# Patient Record
Sex: Female | Born: 1960 | Race: White | Hispanic: No | Smoking: Former smoker
Health system: Southern US, Community
[De-identification: ages and names within clinical notes are randomized; demographics above are authoritative.]

## PROBLEM LIST (undated history)

## (undated) DIAGNOSIS — K219 Gastro-esophageal reflux disease without esophagitis: Secondary | ICD-10-CM

## (undated) DIAGNOSIS — K589 Irritable bowel syndrome without diarrhea: Secondary | ICD-10-CM

## (undated) DIAGNOSIS — F329 Major depressive disorder, single episode, unspecified: Secondary | ICD-10-CM

## (undated) DIAGNOSIS — M1990 Unspecified osteoarthritis, unspecified site: Secondary | ICD-10-CM

## (undated) DIAGNOSIS — T7840XA Allergy, unspecified, initial encounter: Secondary | ICD-10-CM

## (undated) DIAGNOSIS — F419 Anxiety disorder, unspecified: Secondary | ICD-10-CM

## (undated) DIAGNOSIS — F32A Depression, unspecified: Secondary | ICD-10-CM

## (undated) HISTORY — PX: ROUX-EN-Y PROCEDURE: SUR1287

## (undated) HISTORY — DX: Anxiety disorder, unspecified: F41.9

## (undated) HISTORY — DX: Allergy, unspecified, initial encounter: T78.40XA

## (undated) HISTORY — DX: Major depressive disorder, single episode, unspecified: F32.9

## (undated) HISTORY — DX: Irritable bowel syndrome, unspecified: K58.9

## (undated) HISTORY — PX: ABDOMINAL HYSTERECTOMY: SHX81

## (undated) HISTORY — DX: Gastro-esophageal reflux disease without esophagitis: K21.9

## (undated) HISTORY — DX: Depression, unspecified: F32.A

## (undated) HISTORY — DX: Unspecified osteoarthritis, unspecified site: M19.90

## (undated) HISTORY — PX: CHOLECYSTECTOMY: SHX55

---

## 2001-04-14 ENCOUNTER — Ambulatory Visit (HOSPITAL_COMMUNITY): Admission: RE | Admit: 2001-04-14 | Discharge: 2001-04-14 | Payer: Self-pay | Admitting: Obstetrics and Gynecology

## 2001-04-14 ENCOUNTER — Encounter: Payer: Self-pay | Admitting: Obstetrics and Gynecology

## 2001-07-14 ENCOUNTER — Encounter: Payer: Self-pay | Admitting: Internal Medicine

## 2001-07-14 ENCOUNTER — Ambulatory Visit (HOSPITAL_COMMUNITY): Admission: RE | Admit: 2001-07-14 | Discharge: 2001-07-14 | Payer: Self-pay | Admitting: Internal Medicine

## 2001-10-06 ENCOUNTER — Ambulatory Visit (HOSPITAL_COMMUNITY): Admission: RE | Admit: 2001-10-06 | Discharge: 2001-10-06 | Payer: Self-pay | Admitting: Internal Medicine

## 2001-12-07 ENCOUNTER — Encounter: Payer: Self-pay | Admitting: Family Medicine

## 2001-12-07 ENCOUNTER — Ambulatory Visit (HOSPITAL_COMMUNITY): Admission: RE | Admit: 2001-12-07 | Discharge: 2001-12-07 | Payer: Self-pay | Admitting: Family Medicine

## 2002-10-18 ENCOUNTER — Ambulatory Visit (HOSPITAL_COMMUNITY): Admission: RE | Admit: 2002-10-18 | Discharge: 2002-10-18 | Payer: Self-pay | Admitting: Family Medicine

## 2002-10-18 ENCOUNTER — Encounter: Payer: Self-pay | Admitting: Family Medicine

## 2002-11-15 ENCOUNTER — Encounter: Payer: Self-pay | Admitting: Family Medicine

## 2002-11-15 ENCOUNTER — Ambulatory Visit (HOSPITAL_COMMUNITY): Admission: RE | Admit: 2002-11-15 | Discharge: 2002-11-15 | Payer: Self-pay | Admitting: Family Medicine

## 2002-12-12 ENCOUNTER — Ambulatory Visit (HOSPITAL_COMMUNITY): Admission: RE | Admit: 2002-12-12 | Discharge: 2002-12-12 | Payer: Self-pay | Admitting: Family Medicine

## 2002-12-12 ENCOUNTER — Encounter: Payer: Self-pay | Admitting: Family Medicine

## 2003-07-02 ENCOUNTER — Ambulatory Visit (HOSPITAL_COMMUNITY): Admission: RE | Admit: 2003-07-02 | Discharge: 2003-07-02 | Payer: Self-pay | Admitting: Family Medicine

## 2003-07-10 ENCOUNTER — Ambulatory Visit (HOSPITAL_COMMUNITY): Admission: RE | Admit: 2003-07-10 | Discharge: 2003-07-10 | Payer: Self-pay | Admitting: Family Medicine

## 2005-04-17 ENCOUNTER — Ambulatory Visit (HOSPITAL_COMMUNITY): Admission: RE | Admit: 2005-04-17 | Discharge: 2005-04-17 | Payer: Self-pay | Admitting: Family Medicine

## 2005-08-13 ENCOUNTER — Encounter: Admission: RE | Admit: 2005-08-13 | Discharge: 2005-08-13 | Payer: Self-pay | Admitting: Obstetrics and Gynecology

## 2007-07-20 ENCOUNTER — Ambulatory Visit (HOSPITAL_COMMUNITY): Admission: RE | Admit: 2007-07-20 | Discharge: 2007-07-20 | Payer: Self-pay | Admitting: Family Medicine

## 2007-07-27 ENCOUNTER — Ambulatory Visit (HOSPITAL_COMMUNITY): Admission: RE | Admit: 2007-07-27 | Discharge: 2007-07-27 | Payer: Self-pay | Admitting: Family Medicine

## 2008-09-19 ENCOUNTER — Encounter: Admission: RE | Admit: 2008-09-19 | Discharge: 2008-09-19 | Payer: Self-pay | Admitting: Family Medicine

## 2008-11-04 ENCOUNTER — Ambulatory Visit (HOSPITAL_COMMUNITY): Admission: RE | Admit: 2008-11-04 | Discharge: 2008-11-04 | Payer: Self-pay | Admitting: Family Medicine

## 2010-04-12 ENCOUNTER — Encounter: Payer: Self-pay | Admitting: Family Medicine

## 2010-08-03 ENCOUNTER — Other Ambulatory Visit: Payer: Self-pay | Admitting: Obstetrics and Gynecology

## 2010-08-03 DIAGNOSIS — Z1231 Encounter for screening mammogram for malignant neoplasm of breast: Secondary | ICD-10-CM

## 2010-08-07 NOTE — Op Note (Signed)
[facility]  Patient:    Joan Duncan, Joan Duncan Visit Number: [id number] MRN: [id number]          Service Type: END Location: DAY Attending Physician:  Malissa Hippo Dictated by:   Lionel December, M.D. Proc. Date: 10/06/01 Admit Date:  10/06/2001 Discharge Date: 10/06/2001   CC:         Elfredia Nevins, M.D.   Operative Report  PROCEDURE:  Total colonoscopy, terminal ileoscopy.  ENDOSCOPIST:  Lionel December, M.D.  INDICATIONS:  Drinda Butts is a 50 year old Caucasian female with chronic constipation and recurrent abdominal pain.  She has also had intermittent hematochezia.  She is laxative dependent.  She is undergoing a diagnostic colonoscopy.  INFORMED CONSENT:  Risks were reviewed with the patient and informed consent was obtained.  PREOPERATIVE MEDICATIONS: Demerol 50 mg IV, Versed 8 mg IV in divided dose.  INSTRUMENT:  Olympus video system.  DESCRIPTION OF PROCEDURE:  The procedure was performed in the endoscopy suite. The patients vital signs and oxygen saturation were monitored during the procedure and remained stable.  The patient was placed in the left lateral position and rectal examination was performed.  No abnormality was noted on external or digital exam.  The scope was placed in the rectum and advanced under vision to the sigmoid colon and beyond.  Preparation was excellent. 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 The scope was passed to the cecum which was identified by the appendiceal orifice and ileocecal valve.  Short segment of TL was also examined and was negative.  Pictures were taken for the record which were part of her data base.  As the scope was withdrawn the colonic mucosa was once again carefully examined.  There were no polyps, tumors, masses or diverticular changes.  The rectal mucosa was normal.  The scope was retroflexed and examined in the rectal junction and small hemorrhoids  were noted below the dentate line.  The endoscope was straightened and withdrawn. The patient tolerated the procedure well.  FINAL DIAGNOSIS: 1. 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 2. Normal terminal ileum. 3. Small external hemorrhoids. 4. I suspect we could be dealing with constipation, predominant    irritable bowel syndrome.  RECOMMENDATIONS: 1. We will start on Zelnorm 6 mg p.o. b.i.d. 2. TSH and serum calcium will be checked today. 3. Also asked her to take Benefiber fiber 4 g p.o. q.d. 4. She will return for an office visit in four weeks.Dictated by:   Lionel December, M.D. Attending Physician:  Malissa Hippo DD:  10/06/01 TD:  10/11/01 Job: [id number]

## 2010-08-10 ENCOUNTER — Ambulatory Visit
Admission: RE | Admit: 2010-08-10 | Discharge: 2010-08-10 | Disposition: A | Discharge status: Home / Self Care | Payer: BC Managed Care – PPO | Source: Ambulatory Visit | Attending: Obstetrics and Gynecology | Admitting: Obstetrics and Gynecology

## 2010-08-10 DIAGNOSIS — Z1231 Encounter for screening mammogram for malignant neoplasm of breast: Secondary | ICD-10-CM

## 2011-04-21 ENCOUNTER — Other Ambulatory Visit (HOSPITAL_COMMUNITY): Payer: Self-pay | Admitting: Internal Medicine

## 2011-04-21 DIAGNOSIS — R1084 Generalized abdominal pain: Secondary | ICD-10-CM

## 2011-04-22 ENCOUNTER — Other Ambulatory Visit (HOSPITAL_COMMUNITY): Payer: BC Managed Care – PPO

## 2011-04-23 ENCOUNTER — Ambulatory Visit (HOSPITAL_COMMUNITY)
Admission: RE | Admit: 2011-04-23 | Discharge: 2011-04-23 | Disposition: A | Discharge status: Home / Self Care | Payer: BC Managed Care – PPO | Source: Ambulatory Visit | Attending: Internal Medicine | Admitting: Internal Medicine

## 2011-04-23 DIAGNOSIS — K449 Diaphragmatic hernia without obstruction or gangrene: Secondary | ICD-10-CM | POA: Insufficient documentation

## 2011-04-23 DIAGNOSIS — R109 Unspecified abdominal pain: Secondary | ICD-10-CM | POA: Insufficient documentation

## 2011-04-23 DIAGNOSIS — R1084 Generalized abdominal pain: Secondary | ICD-10-CM

## 2011-04-23 MED ORDER — IOHEXOL 300 MG/ML  SOLN
100.0000 mL | Freq: Once | INTRAMUSCULAR | Status: AC | PRN
  Administered 2011-04-23: 100 mL via INTRAVENOUS

## 2011-08-04 ENCOUNTER — Other Ambulatory Visit (HOSPITAL_COMMUNITY): Payer: Self-pay | Admitting: Physician Assistant

## 2011-08-04 DIAGNOSIS — R42 Dizziness and giddiness: Secondary | ICD-10-CM

## 2011-08-05 ENCOUNTER — Ambulatory Visit (HOSPITAL_COMMUNITY)
Admission: RE | Admit: 2011-08-05 | Discharge: 2011-08-05 | Disposition: A | Discharge status: Home / Self Care | Payer: BC Managed Care – PPO | Source: Ambulatory Visit | Attending: Physician Assistant | Admitting: Physician Assistant

## 2011-08-05 DIAGNOSIS — H538 Other visual disturbances: Secondary | ICD-10-CM | POA: Insufficient documentation

## 2011-08-05 DIAGNOSIS — R42 Dizziness and giddiness: Secondary | ICD-10-CM

## 2011-08-05 DIAGNOSIS — R209 Unspecified disturbances of skin sensation: Secondary | ICD-10-CM | POA: Insufficient documentation

## 2011-08-06 ENCOUNTER — Other Ambulatory Visit (HOSPITAL_COMMUNITY): Payer: BC Managed Care – PPO

## 2013-04-30 ENCOUNTER — Other Ambulatory Visit (HOSPITAL_COMMUNITY): Payer: Self-pay | Admitting: Internal Medicine

## 2013-04-30 DIAGNOSIS — Z139 Encounter for screening, unspecified: Secondary | ICD-10-CM

## 2013-05-02 ENCOUNTER — Ambulatory Visit (HOSPITAL_COMMUNITY): Payer: BC Managed Care – PPO

## 2013-05-07 ENCOUNTER — Ambulatory Visit (HOSPITAL_COMMUNITY)
Admission: RE | Admit: 2013-05-07 | Discharge: 2013-05-07 | Disposition: A | Discharge status: Home / Self Care | Payer: BC Managed Care – PPO | Source: Ambulatory Visit | Attending: Internal Medicine | Admitting: Internal Medicine

## 2013-05-07 DIAGNOSIS — Z1231 Encounter for screening mammogram for malignant neoplasm of breast: Secondary | ICD-10-CM | POA: Insufficient documentation

## 2013-05-07 DIAGNOSIS — Z139 Encounter for screening, unspecified: Secondary | ICD-10-CM

## 2013-10-30 ENCOUNTER — Other Ambulatory Visit (HOSPITAL_COMMUNITY): Payer: Self-pay | Admitting: Internal Medicine

## 2013-10-30 DIAGNOSIS — R51 Headache: Secondary | ICD-10-CM

## 2013-11-01 ENCOUNTER — Ambulatory Visit (HOSPITAL_COMMUNITY)
Admission: RE | Admit: 2013-11-01 | Discharge: 2013-11-01 | Disposition: A | Discharge status: Home / Self Care | Payer: BC Managed Care – PPO | Source: Ambulatory Visit | Attending: Internal Medicine | Admitting: Internal Medicine

## 2013-11-01 DIAGNOSIS — R51 Headache: Secondary | ICD-10-CM | POA: Diagnosis present

## 2014-05-22 ENCOUNTER — Ambulatory Visit (INDEPENDENT_AMBULATORY_CARE_PROVIDER_SITE_OTHER): Payer: BC Managed Care – PPO | Admitting: Obstetrics and Gynecology

## 2014-05-22 VITALS — BP 146/86 | Ht 68.0 in | Wt 179.0 lb

## 2014-05-22 DIAGNOSIS — N3946 Mixed incontinence: Secondary | ICD-10-CM | POA: Diagnosis not present

## 2014-05-22 DIAGNOSIS — N952 Postmenopausal atrophic vaginitis: Secondary | ICD-10-CM

## 2014-05-22 MED ORDER — TOLTERODINE TARTRATE ER 2 MG PO CP24: 2.0000 mg | ORAL_CAPSULE | Freq: Every day | ORAL | Status: DC

## 2014-05-22 MED ORDER — ESTRADIOL 1 MG PO TABS: 1.0000 mg | ORAL_TABLET | Freq: Every day | ORAL | Status: DC

## 2014-05-22 NOTE — Progress Notes (Signed)
Patient ID: MARCINA KINNISON, female   DOB: [birth year], 54 y.o.   MRN: [id number] Pt here today to check her bladder. Pt states that her bladder has dropped some more. Pt states that when she wipes after voiding she wipes her bladder. Pt states that she can't hold her urine.   [facility] Visit  Patient name: Joan Duncan MRN [id number]  Date of birth: Dec 28, 1960  CC & HPI:  Joan Duncan is a 53 y.o. female presenting today for urinary sx of SUI with running, exercising, also having urge sx while walking. + incomplete voiding at times.  ROS:  Vasomotor sx.  Pertinent History Reviewed:   Reviewed: Significant for vag hyst post repair 2001 jvf.  Medical        No past medical history on file.                            Surgical Hx:   No past surgical history on file. Medications: Reviewed & Updated - see associated section                       Current outpatient prescriptions:  .  ALPRAZolam (XANAX) 1 MG tablet, , Disp: , Rfl: 2 .  BIOTIN PO, Take 1 tablet by mouth daily., Disp: , Rfl:  .  HYDROcodone-acetaminophen (NORCO) 10-325 MG per tablet, Take 1 tablet by mouth every 4 (four) hours as needed., Disp: , Rfl: 0 .  loratadine (CLARITIN) 10 MG tablet, Take 10 mg by mouth daily., Disp: , Rfl:  .  meclizine (ANTIVERT) 25 MG tablet, Take 25 mg by mouth every 6 (six) hours., Disp: , Rfl: 11 .  MOVANTIK 25 MG TABS, Take 1 tablet by mouth daily., Disp: , Rfl: 11 .  Multiple Vitamin (MULTIVITAMIN) tablet, Take 1 tablet by mouth daily., Disp: , Rfl:  .  Omega-3 Fatty Acids (FISH OIL PEARLS PO), Take 1 capsule by mouth daily., Disp: , Rfl:  .  pantoprazole (PROTONIX) 40 MG tablet, , Disp: , Rfl:    Social History: Reviewed -  has no tobacco history on file.  Objective Findings:  Vitals: Blood pressure 146/86, height 5\' 8"  (1.727 m), weight 179 lb (81.194 kg).  Physical Examination: General appearance - alert, well appearing, and in no distress, oriented to person, place, and time  and normal appearing weight Mental status - alert, oriented to person, place, and time, normal mood, behavior, speech, dress, motor activity, and thought processes, affect normal Abdomen - soft, nontender, nondistended, no masses or organomegaly Pelvic - VULVA: normal appearing vulva with no masses, tenderness or lesions, VAGINA: normal appearing vagina with normal color and discharge, no lesions, atrophic, CERVIX: surgically absent, UTERUS: surgically absent, vaginal cuff well healed Posterior support average. Vag length 10 cm   Assessment & Plan:   A:  1. Mixed incontinence 2. Postmeno vag atrophic tissues  P:  1. Estradiol 1 mg/d 2. Trial Detrol LA

## 2014-05-22 NOTE — Patient Instructions (Signed)
Health Recommendations for Postmenopausal Women Respected and ongoing research has looked at the most common causes of death, disability, and poor quality of life in postmenopausal women. The causes include heart disease, diseases of blood vessels, diabetes, depression, cancer, and bone loss (osteoporosis). Many things can be done to help lower the chances of developing these and other common problems. CARDIOVASCULAR DISEASE Heart Disease: A heart attack is a medical emergency. Know the signs and symptoms of a heart attack. Below are things women can do to reduce their risk for heart disease.   Do not smoke. If you smoke, quit.  Aim for a healthy weight. Being overweight causes many preventable deaths. Eat a healthy and balanced diet and drink an adequate amount of liquids.  Get moving. Make a commitment to be more physically active. Aim for 30 minutes of activity on most, if not all days of the week.  Eat for heart health. Choose a diet that is low in saturated fat and cholesterol and eliminate trans fat. Include whole grains, vegetables, and fruits. Read and understand the labels on food containers before buying.  Know your numbers. Ask your caregiver to check your blood pressure, cholesterol (total, HDL, LDL, triglycerides) and blood glucose. Work with your caregiver on improving your entire clinical picture.  High blood pressure. Limit or stop your table salt intake (try salt substitute and food seasonings). Avoid salty foods and drinks. Read labels on food containers before buying. Eating well and exercising can help control high blood pressure. STROKE  Stroke is a medical emergency. Stroke may be the result of a blood clot in a blood vessel in the brain or by a brain hemorrhage (bleeding). Know the signs and symptoms of a stroke. To lower the risk of developing a stroke:  Avoid fatty foods.  Quit smoking.  Control your diabetes, blood pressure, and irregular heart rate. THROMBOPHLEBITIS  (BLOOD CLOT) OF THE LEG  Becoming overweight and leading a stationary lifestyle may also contribute to developing blood clots. Controlling your diet and exercising will help lower the risk of developing blood clots. CANCER SCREENING  Breast Cancer: Take steps to reduce your risk of breast cancer.  You should practice "breast self-awareness." This means understanding the normal appearance and feel of your breasts and should include breast self-examination. Any changes detected, no matter how small, should be reported to your caregiver.  After age 40, you should have a clinical breast exam (CBE) every year.  Starting at age 40, you should consider having a mammogram (breast X-ray) every year.  If you have a family history of breast cancer, talk to your caregiver about genetic screening.  If you are at high risk for breast cancer, talk to your caregiver about having an MRI and a mammogram every year.  Intestinal or Stomach Cancer: Tests to consider are a rectal exam, fecal occult blood, sigmoidoscopy, and colonoscopy. Women who are high risk may need to be screened at an earlier age and more often.  Cervical Cancer:  Beginning at age 30, you should have a Pap test every 3 years as long as the past 3 Pap tests have been normal.  If you have had past treatment for cervical cancer or a condition that could lead to cancer, you need Pap tests and screening for cancer for at least 20 years after your treatment.  If you had a hysterectomy for a problem that was not cancer or a condition that could lead to cancer, then you no longer need Pap tests.    If you are between ages 65 and 70, and you have had normal Pap tests going back 10 years, you no longer need Pap tests.  If Pap tests have been discontinued, risk factors (such as a new sexual partner) need to be reassessed to determine if screening should be resumed.  Some medical problems can increase the chance of getting cervical cancer. In these  cases, your caregiver may recommend more frequent screening and Pap tests.  Uterine Cancer: If you have vaginal bleeding after reaching menopause, you should notify your caregiver.  Ovarian Cancer: Other than yearly pelvic exams, there are no reliable tests available to screen for ovarian cancer at this time except for yearly pelvic exams.  Lung Cancer: Yearly chest X-rays can detect lung cancer and should be done on high risk women, such as cigarette smokers and women with chronic lung disease (emphysema).  Skin Cancer: A complete body skin exam should be done at your yearly examination. Avoid overexposure to the sun and ultraviolet light lamps. Use a strong sun block cream when in the sun. All of these things are important for lowering the risk of skin cancer. MENOPAUSE Menopause Symptoms: Hormone therapy products are effective for treating symptoms associated with menopause:  Moderate to severe hot flashes.  Night sweats.  Mood swings.  Headaches.  Tiredness.  Loss of sex drive.  Insomnia.  Other symptoms. Hormone replacement carries certain risks, especially in older women. Women who use or are thinking about using estrogen or estrogen with progestin treatments should discuss that with their caregiver. Your caregiver will help you understand the benefits and risks. The ideal dose of hormone replacement therapy is not known. The Food and Drug Administration (FDA) has concluded that hormone therapy should be used only at the lowest doses and for the shortest amount of time to reach treatment goals.  OSTEOPOROSIS Protecting Against Bone Loss and Preventing Fracture If you use hormone therapy for prevention of bone loss (osteoporosis), the risks for bone loss must outweigh the risk of the therapy. Ask your caregiver about other medications known to be safe and effective for preventing bone loss and fractures. To guard against bone loss or fractures, the following is recommended:  If  you are younger than age 50, take 1000 mg of calcium and at least 600 mg of Vitamin D per day.  If you are older than age 50 but younger than age 70, take 1200 mg of calcium and at least 600 mg of Vitamin D per day.  If you are older than age 70, take 1200 mg of calcium and at least 800 mg of Vitamin D per day. Smoking and excessive alcohol intake increases the risk of osteoporosis. Eat foods rich in calcium and vitamin D and do weight bearing exercises several times a week as your caregiver suggests. DIABETES Diabetes Mellitus: If you have type I or type 2 diabetes, you should keep your blood sugar under control with diet, exercise, and recommended medication. Avoid starchy and fatty foods, and too many sweets. Being overweight can make diabetes control more difficult. COGNITION AND MEMORY Cognition and Memory: Menopausal hormone therapy is not recommended for the prevention of cognitive disorders such as Alzheimer's disease or memory loss.  DEPRESSION  Depression may occur at any age, but it is common in elderly women. This may be because of physical, medical, social (loneliness), or financial problems and needs. If you are experiencing depression because of medical problems and control of symptoms, talk to your caregiver about this. Physical   activity and exercise may help with mood and sleep. Community and volunteer involvement may improve your sense of value and worth. If you have depression and you feel that the problem is getting worse or becoming severe, talk to your caregiver about which treatment options are best for you. ACCIDENTS  Accidents are common and can be serious in elderly woman. Prepare your house to prevent accidents. Eliminate throw rugs, place hand bars in bath, shower, and toilet areas. Avoid wearing high heeled shoes or walking on wet, snowy, and icy areas. Limit or stop driving if you have vision or hearing problems, or if you feel you are unsteady with your movements and  reflexes. HEPATITIS C Hepatitis C is a type of viral infection affecting the liver. It is spread mainly through contact with blood from an infected person. It can be treated, but if left untreated, it can lead to severe liver damage over the years. Many people who are infected do not know that the virus is in their blood. If you are a "baby-boomer", it is recommended that you have one screening test for Hepatitis C. IMMUNIZATIONS  Several immunizations are important to consider having during your senior years, including:   Tetanus, diphtheria, and pertussis booster shot.  Influenza every year before the flu season begins.  Pneumonia vaccine.  Shingles vaccine.  Others, as indicated based on your specific needs. Talk to your caregiver about these. Document Released: 04/30/2005 Document Revised: 07/23/2013 Document Reviewed: 12/25/2007 ExitCare Patient Information 2015 [employer]. This information is not intended to replace advice given to you by your health care provider. Make sure you discuss any questions you have with your health care provider.  

## 2014-07-05 ENCOUNTER — Encounter: Payer: Self-pay | Admitting: Obstetrics and Gynecology

## 2014-07-05 ENCOUNTER — Ambulatory Visit (INDEPENDENT_AMBULATORY_CARE_PROVIDER_SITE_OTHER): Payer: BC Managed Care – PPO | Admitting: Obstetrics and Gynecology

## 2014-07-05 VITALS — BP 140/100 | Ht 68.0 in | Wt 181.0 lb

## 2014-07-05 DIAGNOSIS — N393 Stress incontinence (female) (male): Secondary | ICD-10-CM | POA: Diagnosis not present

## 2014-07-05 NOTE — Progress Notes (Addendum)
Patient ID: MILADY FLEENER, female   DOB: 1960/04/20, 54 y.o.   MRN: [id number]  This chart was SCRIBED for Mallory Shirk, MD by Stephania Fragmin, ED Scribe. This patient was seen in room 1 and the patient's care was started at 9:24 AM.    [facility] Visit  Patient name: Joan Duncan MRN [id number]  Date of birth: 01-29-1961  CC & HPI:  Joan Duncan is a 54 y.o. female presenting today for follow-up of mixed incontinence and postmenopausal vaginal atrophic tissues. I had prescribed her Estradiol and a trial of Detrol LA.  Patient reports reduced incontinence after taking the Detrol LA. She however still notes incontinence when running.  Patient has also noted malodor emanating from her groin region. She is unsure whether that is due to urine remaining on her panties, or to fecal matter because she has a hemorrhoid.   She reports she hasn't had sex in 2 years with her 50 y.o. partner of 16 years. She would like to, but she is no longer sexually attracted to him, as he doesn't seem interested in taking care of himself hygienically anymore and is on disability.  ROS:  A complete 10 system review of systems was obtained and all systems are negative except as noted in the HPI and PMH.    Pertinent History Reviewed:   Reviewed: Significant for abdominal hysterectomy Medical         Past Medical History  Diagnosis Date  . IBS (irritable bowel syndrome)   . GERD (gastroesophageal reflux disease)   . Allergy   . Anxiety   . Depression   . Arthritis                               Surgical Hx:    Past Surgical History  Procedure Laterality Date  . Cholecystectomy    . Abdominal hysterectomy    . Roux-en-y procedure     Medications: Reviewed & Updated - see associated section                       Current outpatient prescriptions:  .  ALPRAZolam (XANAX) 1 MG tablet, , Disp: , Rfl: 2 .  BIOTIN PO, Take 1 tablet by mouth daily., Disp: , Rfl:  .  estradiol (ESTRACE) 1 MG tablet, Take  1 tablet (1 mg total) by mouth daily., Disp: 30 tablet, Rfl: 11 .  HYDROcodone-acetaminophen (NORCO) 10-325 MG per tablet, Take 1 tablet by mouth every 4 (four) hours as needed., Disp: , Rfl: 0 .  loratadine (CLARITIN) 10 MG tablet, Take 10 mg by mouth daily., Disp: , Rfl:  .  meclizine (ANTIVERT) 25 MG tablet, Take 25 mg by mouth every 6 (six) hours., Disp: , Rfl: 11 .  MOVANTIK 25 MG TABS, Take 1 tablet by mouth daily., Disp: , Rfl: 11 .  Multiple Vitamin (MULTIVITAMIN) tablet, Take 1 tablet by mouth daily., Disp: , Rfl:  .  Omega-3 Fatty Acids (FISH OIL PEARLS PO), Take 1 capsule by mouth daily., Disp: , Rfl:  .  pantoprazole (PROTONIX) 40 MG tablet, , Disp: , Rfl:  .  tolterodine (DETROL LA) 2 MG 24 hr capsule, Take 1 capsule (2 mg total) by mouth daily., Disp: 30 capsule, Rfl: 5   Social History: Reviewed -  reports that she has quit smoking. Her smoking use included Cigarettes. She quit after 40 years of use. She has  never used smokeless tobacco.  Objective Findings:  Vitals: Blood pressure 140/100, height 5\' 8"  (1.727 m), weight 181 lb (82.101 kg).  Physical Examination: General appearance - alert, well appearing, and in no distress, oriented to person, place, and time and overweight Mental status - alert, oriented to person, place, and time, normal mood, behavior, speech, dress, motor activity, and thought processes, affect appropriate to mood Pelvic - normal external genitalia VULVA: normal appearing vulva with no masses, tenderness or lesions,  VAGINA: postmenopausal tissues ADNEXA: normal adnexa in size, nontender and no masses,  RECTAL: good support   Assessment & Plan:   A:  1. SUI controlled by HT and Ditropan 2. Domestic issues,  Over 25 min spent in visit  P:  1. Renew Rx's x 1 yr  I personally performed the services described in this documentation, which was SCRIBED in my presence. The recorded information has been reviewed and considered accurate. It has been edited  as necessary during review. Jonnie Kind, MD

## 2014-07-05 NOTE — Progress Notes (Signed)
Patient ID: Joan Duncan, female   DOB: Dec 05, 1960, 54 y.o.   MRN: [id number] Pt here today for follow up from last visit. Pt states that the medication did help the bladder spasms but she still feels her bladder "down there". Pt states that she feels her bladder.

## 2014-11-04 ENCOUNTER — Other Ambulatory Visit: Payer: Self-pay | Admitting: Obstetrics and Gynecology

## 2015-03-07 ENCOUNTER — Other Ambulatory Visit: Payer: Self-pay | Admitting: Internal Medicine

## 2015-03-07 DIAGNOSIS — Z1231 Encounter for screening mammogram for malignant neoplasm of breast: Secondary | ICD-10-CM

## 2015-03-12 ENCOUNTER — Encounter (INDEPENDENT_AMBULATORY_CARE_PROVIDER_SITE_OTHER): Payer: Self-pay | Admitting: *Deleted

## 2015-03-28 ENCOUNTER — Ambulatory Visit
Admission: RE | Admit: 2015-03-28 | Discharge: 2015-03-28 | Disposition: A | Discharge status: Home / Self Care | Payer: BC Managed Care – PPO | Source: Ambulatory Visit | Attending: Internal Medicine | Admitting: Internal Medicine

## 2015-03-28 DIAGNOSIS — Z1231 Encounter for screening mammogram for malignant neoplasm of breast: Secondary | ICD-10-CM

## 2015-09-30 ENCOUNTER — Telehealth: Payer: Self-pay

## 2015-09-30 NOTE — Telephone Encounter (Signed)
Tried to call but she was not home

## 2015-09-30 NOTE — Telephone Encounter (Addendum)
Gastroenterology Pre-Procedure Review  Request Date: Requesting Physician:   PATIENT REVIEW QUESTIONS: The patient responded to the following health history questions as indicated:    1. Diabetes Melitis:NO 2. Joint replacements in the past 12 months: NO 3. Major health problems in the past 3 months: NO 4. Has an artificial valve or MVP: NO 5. Has a defibrillator: NO 6. Has been advised in past to take antibiotics in advance of a procedure like teeth cleaning: NO 7. Family history of colon cancer: NO 8. Alcohol Use: YES ,VERY LITTLE 9. History of sleep apnea: NO    MEDICATIONS & ALLERGIES:    Patient reports the following regarding taking any blood thinners:   Plavix? NO Aspirin? YES Coumadin? NO  Patient confirms/reports the following medications:  Current Outpatient Prescriptions  Medication Sig Dispense Refill  . ALPRAZolam (XANAX) 1 MG tablet   2  . aspirin 81 MG tablet Take 81 mg by mouth daily.    Marland Kitchen BIOTIN PO Take 1 tablet by mouth daily.    . cyclobenzaprine (FLEXERIL) 10 MG tablet Take 10 mg by mouth 3 (three) times daily as needed for muscle spasms.    Marland Kitchen loratadine (CLARITIN) 10 MG tablet Take 10 mg by mouth daily.    . Multiple Vitamin (MULTIVITAMIN) tablet Take 1 tablet by mouth daily.    . Omega-3 Fatty Acids (FISH OIL PEARLS PO) Take 1 capsule by mouth daily.    . pantoprazole (PROTONIX) 40 MG tablet     . zolpidem (AMBIEN) 10 MG tablet Take 10 mg by mouth at bedtime as needed for sleep.    Marland Kitchen estradiol (ESTRACE) 1 MG tablet Take 1 tablet (1 mg total) by mouth daily. 30 tablet 11   No current facility-administered medications for this visit.    Patient confirms/reports the following allergies:  No Known Allergies  No orders of the defined types were placed in this encounter.    AUTHORIZATION INFORMATION Primary Insurance: [place],  [place] #:[id number] ,  Group #: [id number] Pre-Cert / Josem Kaufmann required: Pre-Cert / Auth #:    SCHEDULE INFORMATION: Procedure has been  scheduled as follows:  Date: , Time:  Location:   This Gastroenterology Pre-Precedure Review Form is being routed to the following provider(s):  Held spot for 10/30/15 @ 8:30 AM for RMR

## 2015-09-30 NOTE — Telephone Encounter (Signed)
Patient called to schedule tcs   [phone] home, cell [phone]

## 2015-09-30 NOTE — Addendum Note (Signed)
Addended by: Marlou Porch on: 09/30/2015 04:13 PM   Modules accepted: Medications

## 2015-10-01 NOTE — Addendum Note (Signed)
Addended by: Mahala Menghini on: 10/01/2015 10:41 AM   Modules accepted: Orders, Medications

## 2015-10-01 NOTE — Telephone Encounter (Signed)
Ok to schedule.

## 2015-10-02 ENCOUNTER — Other Ambulatory Visit: Payer: Self-pay

## 2015-10-02 DIAGNOSIS — Z1211 Encounter for screening for malignant neoplasm of colon: Secondary | ICD-10-CM

## 2015-10-02 MED ORDER — PEG-KCL-NACL-NASULF-NA ASC-C 100 G PO SOLR: 1.0000 | ORAL | Status: DC

## 2015-10-02 NOTE — Telephone Encounter (Signed)
Instructions are in the mail and Rx called into pharmacy. She is aware

## 2015-10-30 ENCOUNTER — Ambulatory Visit (HOSPITAL_COMMUNITY)
Admission: RE | Admit: 2015-10-30 | Discharge: 2015-10-30 | Disposition: A | Discharge status: Home / Self Care | Payer: BC Managed Care – PPO | Source: Ambulatory Visit | Attending: Internal Medicine | Admitting: Internal Medicine

## 2015-10-30 ENCOUNTER — Encounter (HOSPITAL_COMMUNITY)
Admission: RE | Disposition: A | Discharge status: Home / Self Care | Payer: Self-pay | Source: Ambulatory Visit | Attending: Internal Medicine

## 2015-10-30 ENCOUNTER — Encounter (HOSPITAL_COMMUNITY): Payer: Self-pay | Admitting: *Deleted

## 2015-10-30 DIAGNOSIS — F329 Major depressive disorder, single episode, unspecified: Secondary | ICD-10-CM | POA: Insufficient documentation

## 2015-10-30 DIAGNOSIS — Z79899 Other long term (current) drug therapy: Secondary | ICD-10-CM | POA: Diagnosis not present

## 2015-10-30 DIAGNOSIS — M1990 Unspecified osteoarthritis, unspecified site: Secondary | ICD-10-CM | POA: Insufficient documentation

## 2015-10-30 DIAGNOSIS — Z1211 Encounter for screening for malignant neoplasm of colon: Secondary | ICD-10-CM | POA: Diagnosis not present

## 2015-10-30 DIAGNOSIS — Z7982 Long term (current) use of aspirin: Secondary | ICD-10-CM | POA: Insufficient documentation

## 2015-10-30 DIAGNOSIS — K641 Second degree hemorrhoids: Secondary | ICD-10-CM | POA: Diagnosis not present

## 2015-10-30 DIAGNOSIS — K589 Irritable bowel syndrome without diarrhea: Secondary | ICD-10-CM | POA: Insufficient documentation

## 2015-10-30 DIAGNOSIS — K219 Gastro-esophageal reflux disease without esophagitis: Secondary | ICD-10-CM | POA: Diagnosis not present

## 2015-10-30 DIAGNOSIS — Z87891 Personal history of nicotine dependence: Secondary | ICD-10-CM | POA: Insufficient documentation

## 2015-10-30 DIAGNOSIS — D122 Benign neoplasm of ascending colon: Secondary | ICD-10-CM | POA: Diagnosis not present

## 2015-10-30 DIAGNOSIS — Z8371 Family history of colonic polyps: Secondary | ICD-10-CM

## 2015-10-30 DIAGNOSIS — F419 Anxiety disorder, unspecified: Secondary | ICD-10-CM | POA: Diagnosis not present

## 2015-10-30 HISTORY — PX: COLONOSCOPY: SHX5424

## 2015-10-30 HISTORY — PX: POLYPECTOMY: SHX5525

## 2015-10-30 SURGERY — COLONOSCOPY
Anesthesia: Moderate Sedation

## 2015-10-30 MED ORDER — MIDAZOLAM HCL 5 MG/5ML IJ SOLN
INTRAMUSCULAR | Status: DC | PRN
  Administered 2015-10-30: 2 mg via INTRAVENOUS
  Administered 2015-10-30: 1 mg via INTRAVENOUS
  Administered 2015-10-30: 2 mg via INTRAVENOUS
  Administered 2015-10-30: 1 mg via INTRAVENOUS
  Administered 2015-10-30: 2 mg via INTRAVENOUS

## 2015-10-30 MED ORDER — LIDOCAINE HCL 2 % EX GEL
CUTANEOUS | Status: AC
  Filled 2015-10-30: qty 30

## 2015-10-30 MED ORDER — MIDAZOLAM HCL 5 MG/5ML IJ SOLN
INTRAMUSCULAR | Status: AC
  Filled 2015-10-30: qty 10

## 2015-10-30 MED ORDER — PROMETHAZINE HCL 25 MG/ML IJ SOLN
INTRAMUSCULAR | Status: AC
  Filled 2015-10-30: qty 1

## 2015-10-30 MED ORDER — ONDANSETRON HCL 4 MG/2ML IJ SOLN
INTRAMUSCULAR | Status: AC
  Filled 2015-10-30: qty 2

## 2015-10-30 MED ORDER — STERILE WATER FOR IRRIGATION IR SOLN
Status: DC | PRN
  Administered 2015-10-30: 09:00:00

## 2015-10-30 MED ORDER — MEPERIDINE HCL 100 MG/ML IJ SOLN
INTRAMUSCULAR | Status: DC | PRN
  Administered 2015-10-30 (×2): 50 mg via INTRAVENOUS
  Administered 2015-10-30: 25 mg via INTRAVENOUS

## 2015-10-30 MED ORDER — PROMETHAZINE HCL 25 MG/ML IJ SOLN
INTRAMUSCULAR | Status: DC | PRN
  Administered 2015-10-30: 12.5 mg via INTRAVENOUS

## 2015-10-30 MED ORDER — MEPERIDINE HCL 100 MG/ML IJ SOLN
INTRAMUSCULAR | Status: AC
  Filled 2015-10-30: qty 2

## 2015-10-30 MED ORDER — ONDANSETRON HCL 4 MG/2ML IJ SOLN
INTRAMUSCULAR | Status: DC | PRN
  Administered 2015-10-30: 4 mg via INTRAVENOUS

## 2015-10-30 MED ORDER — SODIUM CHLORIDE 0.9 % IV SOLN
INTRAVENOUS | Status: DC
  Administered 2015-10-30: 1000 mL via INTRAVENOUS

## 2015-10-30 NOTE — H&P (Signed)
$'@LOGO'H$ @   Primary Care Physician:  Glo Herring., MD Primary Gastroenterologist:  Dr. Gala Romney  Pre-Procedure History & Physical: HPI:  Joan Duncan is a 55 y.o. female is here for a screening colonoscopy.   Chronic constipation. History of colonoscopy around age 67. Father with a history of colon polyps. 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  Past Medical History:  Diagnosis Date  . Allergy   . Anxiety   . Arthritis   . Depression   . GERD (gastroesophageal reflux disease)   . IBS (irritable bowel syndrome)     Past Surgical History:  Procedure Laterality Date  . ABDOMINAL HYSTERECTOMY    . CHOLECYSTECTOMY    . ROUX-EN-Y PROCEDURE      Prior to Admission medications   Medication Sig Start Date End Date Taking? Authorizing Provider  ALPRAZolam Duanne Moron) 1 MG tablet  04/27/14  Yes Historical Provider, MD  cyclobenzaprine (FLEXERIL) 10 MG tablet Take 10 mg by mouth 3 (three) times daily as needed for muscle spasms.   Yes Historical Provider, MD  aspirin 81 MG tablet Take 81 mg by mouth daily.    Historical Provider, MD  BIOTIN PO Take 1 tablet by mouth daily.    Historical Provider, MD  estradiol (ESTRACE) 1 MG tablet Take 1 tablet (1 mg total) by mouth daily. 05/22/14 05/22/15  Jonnie Kind, MD  loratadine (CLARITIN) 10 MG tablet Take 10 mg by mouth daily.    Historical Provider, MD  Multiple Vitamin (MULTIVITAMIN) tablet Take 1 tablet by mouth daily.    Historical Provider, MD  Omega-3 Fatty Acids (FISH OIL PEARLS PO) Take 1 capsule by mouth daily.    Historical Provider, MD  pantoprazole (PROTONIX) 40 MG tablet  05/20/14   Historical Provider, MD  peg 3350 powder (MOVIPREP) 100 g SOLR Take 1 kit (200 g total) by mouth as directed. 10/02/15   Daneil Dolin, MD  zolpidem (AMBIEN) 10 MG tablet Take 10 mg by mouth at bedtime as needed for sleep.    Historical Provider, MD    Allergies as of 10/02/2015  . (No Known  Allergies)    Family History  Problem Relation Age of Onset  . Stroke Mother   . Alzheimer's disease Mother   . Heart attack Brother     Social History   Social History  . Marital status: Married    Spouse name: N/A  . Number of children: N/A  . Years of education: N/A   Occupational History  . Not on file.   Social History Main Topics  . Smoking status: Former Smoker    Years: 40.00    Types: Cigarettes  . Smokeless tobacco: Never Used     Comment: quit 3 years ago  . Alcohol use Yes     Comment: on occasion  . Drug use: No  . Sexual activity: Not Currently   Other Topics Concern  . Not on file   Social History Narrative  . No narrative on file    Review of Systems: See HPI, otherwise negative ROS  Physical Exam: BP (!) 134/94   Pulse 76   Temp 97.8 F (36.6 C) (Oral)   Resp 15   Ht '5\' 8"'$  (1.727 m)   Wt 185 lb (83.9 kg)   SpO2 99%   BMI 28.13 kg/m  General:   Alert,  Well-developed, well-nourished, pleasant and cooperative in NAD Head:  Normocephalic and atraumatic. Lungs:  Clear throughout to  auscultation.   No wheezes, crackles, or rhonchi. No acute distress. Heart:  Regular rate and rhythm; no murmurs, clicks, rubs,  or gallops. Abdomen:  Soft, nontender and nondistended. No masses, hepatosplenomegaly or hernias noted. Normal bowel sounds, without guarding, and without rebound.   Extremities:  Without clubbing or edema. Neurologic:  Alert and  oriented x4;  grossly normal neurologically.  Impression/Plan: Joan Duncan is now here to undergo a screening colonoscopy.  Positive family history of colon polyps.  Risks, benefits, limitations, imponderables and alternatives regarding colonoscopy have been reviewed with the patient. Questions have been answered. All parties agreeable.     Notice:  This dictation was prepared with Dragon dictation along with smaller phrase technology. Any transcriptional errors that result from this process are  unintentional and may not be corrected upon review.

## 2015-10-30 NOTE — Discharge Instructions (Addendum)
Colonoscopy Discharge Instructions  Read the instructions outlined below and refer to this sheet in the next few weeks. These discharge instructions provide you with general information on caring for yourself after you leave the hospital. Your doctor may also give you specific instructions. While your treatment has been planned according to the most current medical practices available, unavoidable complications occasionally occur. If you have any problems or questions after discharge, call Dr. Gala Romney at [phone]. ACTIVITY  You may resume your regular activity, but move at a slower pace for the next 24 hours.   Take frequent rest periods for the next 24 hours.   Walking will help get rid of the air and reduce the bloated feeling in your belly (abdomen).   No driving for 24 hours (because of the medicine (anesthesia) used during the test).    Do not sign any important legal documents or operate any machinery for 24 hours (because of the anesthesia used during the test).  NUTRITION  Drink plenty of fluids.   You may resume your normal diet as instructed by your doctor.   Begin with a light meal and progress to your normal diet. Heavy or fried foods are harder to digest and may make you feel sick to your stomach (nauseated).   Avoid alcoholic beverages for 24 hours or as instructed.  MEDICATIONS  You may resume your normal medications unless your doctor tells you otherwise.  WHAT YOU CAN EXPECT TODAY  Some feelings of bloating in the abdomen.   Passage of more gas than usual.   Spotting of blood in your stool or on the toilet paper.  IF YOU HAD POLYPS REMOVED DURING THE COLONOSCOPY:  No aspirin products for 7 days or as instructed.   No alcohol for 7 days or as instructed.   Eat a soft diet for the next 24 hours.  FINDING OUT THE RESULTS OF YOUR TEST Not all test results are available during your visit. If your test results are not back during the visit, make an appointment  with your caregiver to find out the results. Do not assume everything is normal if you have not heard from your caregiver or the medical facility. It is important for you to follow up on all of your test results.  SEEK IMMEDIATE MEDICAL ATTENTION IF:  You have more than a spotting of blood in your stool.   Your belly is swollen (abdominal distention).   You are nauseated or vomiting.   You have a temperature over 101.   You have abdominal pain or discomfort that is severe or gets worse throughout the day.   Constipation, hemorrhoid and colon polyp information provided  Pamphlet on hemorrhoids banding provided  Office visit with [place] in 3 months  Use MiraLAX 17 g orally at bedtime as needed for constipation  Further recommendations to follow pending review of pathology report  Colon Polyps Polyps are lumps of extra tissue growing inside the body. Polyps can grow in the large intestine (colon). Most colon polyps are noncancerous (benign). However, some colon polyps can become cancerous over time. Polyps that are larger than a pea may be harmful. To be safe, caregivers remove and test all polyps. CAUSES  Polyps form when mutations in the genes cause your cells to grow and divide even though no more tissue is needed. RISK FACTORS There are a number of risk factors that can increase your chances of getting colon polyps. They include:  Being older than 50 years.  Family history of  colon polyps or colon cancer.  Long-term colon diseases, such as colitis or Crohn disease.  Being overweight.  Smoking.  Being inactive.  Drinking too much alcohol. SYMPTOMS  Most small polyps do not cause symptoms. If symptoms are present, they may include:  Blood in the stool. The stool may look dark red or black.  Constipation or diarrhea that lasts longer than 1 week. DIAGNOSIS People often do not know they have polyps until their caregiver finds them during a regular checkup. Your caregiver  can use 4 tests to check for polyps:  Digital rectal exam. The caregiver wears gloves and feels inside the rectum. This test would find polyps only in the rectum.  Barium enema. The caregiver puts a liquid called barium into your rectum before taking X-rays of your colon. Barium makes your colon look white. Polyps are dark, so they are easy to see in the X-ray pictures.  Sigmoidoscopy. A thin, flexible tube (sigmoidoscope) is placed into your rectum. The sigmoidoscope has a light and tiny camera in it. The caregiver uses the sigmoidoscope to look at the last third of your colon.  Colonoscopy. This test is like sigmoidoscopy, but the caregiver looks at the entire colon. This is the most common method for finding and removing polyps. TREATMENT  Any polyps will be removed during a sigmoidoscopy or colonoscopy. The polyps are then tested for cancer. PREVENTION  To help lower your risk of getting more colon polyps:  Eat plenty of fruits and vegetables. Avoid eating fatty foods.  Do not smoke.  Avoid drinking alcohol.  Exercise every day.  Lose weight if recommended by your caregiver.  Eat plenty of calcium and folate. Foods that are rich in calcium include milk, cheese, and broccoli. Foods that are rich in folate include chickpeas, kidney beans, and spinach. HOME CARE INSTRUCTIONS Keep all follow-up appointments as directed by your caregiver. You may need periodic exams to check for polyps. SEEK MEDICAL CARE IF: You notice bleeding during a bowel movement.   This information is not intended to replace advice given to you by your health care provider. Make sure you discuss any questions you have with your health care provider.   Document Released: 12/03/2003 Document Revised: 03/29/2014 Document Reviewed: 05/18/2011 Elsevier Interactive Patient Education 2016 [employer].   Hemorrhoids Hemorrhoids are swollen veins around the rectum or anus. There are two types of hemorrhoids:    Internal hemorrhoids. These occur in the veins just inside the rectum. They may poke through to the outside and become irritated and painful.  External hemorrhoids. These occur in the veins outside the anus and can be felt as a painful swelling or hard lump near the anus. CAUSES  Pregnancy.   Obesity.   Constipation or diarrhea.   Straining to have a bowel movement.   Sitting for long periods on the toilet.  Heavy lifting or other activity that caused you to strain.  Anal intercourse. SYMPTOMS   Pain.   Anal itching or irritation.   Rectal bleeding.   Fecal leakage.   Anal swelling.   One or more lumps around the anus.  DIAGNOSIS  Your caregiver may be able to diagnose hemorrhoids by visual examination. Other examinations or tests that may be performed include:   Examination of the rectal area with a gloved hand (digital rectal exam).   Examination of anal canal using a small tube (scope).   A blood test if you have lost a significant amount of blood.  A test to look  inside the colon (sigmoidoscopy or colonoscopy). TREATMENT Most hemorrhoids can be treated at home. However, if symptoms do not seem to be getting better or if you have a lot of rectal bleeding, your caregiver may perform a procedure to help make the hemorrhoids get smaller or remove them completely. Possible treatments include:   Placing a rubber band at the base of the hemorrhoid to cut off the circulation (rubber band ligation).   Injecting a chemical to shrink the hemorrhoid (sclerotherapy).   Using a tool to burn the hemorrhoid (infrared light therapy).   Surgically removing the hemorrhoid (hemorrhoidectomy).   Stapling the hemorrhoid to block blood flow to the tissue (hemorrhoid stapling).  HOME CARE INSTRUCTIONS   Eat foods with fiber, such as whole grains, beans, nuts, fruits, and vegetables. Ask your doctor about taking products with added fiber in them  (fibersupplements).  Increase fluid intake. Drink enough water and fluids to keep your urine clear or pale yellow.   Exercise regularly.   Go to the bathroom when you have the urge to have a bowel movement. Do not wait.   Avoid straining to have bowel movements.   Keep the anal area dry and clean. Use wet toilet paper or moist towelettes after a bowel movement.   Medicated creams and suppositories may be used or applied as directed.   Only take over-the-counter or prescription medicines as directed by your caregiver.   Take warm sitz baths for 15-20 minutes, 3-4 times a day to ease pain and discomfort.   Place ice packs on the hemorrhoids if they are tender and swollen. Using ice packs between sitz baths may be helpful.   Put ice in a plastic bag.   Place a towel between your skin and the bag.   Leave the ice on for 15-20 minutes, 3-4 times a day.   Do not use a donut-shaped pillow or sit on the toilet for long periods. This increases blood pooling and pain.  SEEK MEDICAL CARE IF:  You have increasing pain and swelling that is not controlled by treatment or medicine.  You have uncontrolled bleeding.  You have difficulty or you are unable to have a bowel movement.  You have pain or inflammation outside the area of the hemorrhoids. MAKE SURE YOU:  Understand these instructions.  Will watch your condition.  Will get help right away if you are not doing well or get worse.   This information is not intended to replace advice given to you by your health care provider. Make sure you discuss any questions you have with your health care provider.   Document Released: 03/05/2000 Document Revised: 02/23/2012 Document Reviewed: 01/11/2012 Elsevier Interactive Patient Education 2016 [employer].  Constipation, Adult Constipation is when a person has fewer than three bowel movements a week, has difficulty having a bowel movement, or has stools that are dry, hard,  or larger than normal. As people grow older, constipation is more common. A low-fiber diet, not taking in enough fluids, and taking certain medicines may make constipation worse.  CAUSES   Certain medicines, such as antidepressants, pain medicine, iron supplements, antacids, and water pills.   Certain diseases, such as diabetes, irritable bowel syndrome (IBS), thyroid disease, or depression.   Not drinking enough water.   Not eating enough fiber-rich foods.   Stress or travel.   Lack of physical activity or exercise.   Ignoring the urge to have a bowel movement.   Using laxatives too much.  SIGNS AND SYMPTOMS  Having fewer than three bowel movements a week.   Straining to have a bowel movement.   Having stools that are hard, dry, or larger than normal.   Feeling full or bloated.   Pain in the lower abdomen.   Not feeling relief after having a bowel movement.  DIAGNOSIS  Your health care provider will take a medical history and perform a physical exam. Further testing may be done for severe constipation. Some tests may include:  A barium enema X-ray to examine your rectum, colon, and, sometimes, your small intestine.   A sigmoidoscopy to examine your lower colon.   A colonoscopy to examine your entire colon. TREATMENT  Treatment will depend on the severity of your constipation and what is causing it. Some dietary treatments include drinking more fluids and eating more fiber-rich foods. Lifestyle treatments may include regular exercise. If these diet and lifestyle recommendations do not help, your health care provider may recommend taking over-the-counter laxative medicines to help you have bowel movements. Prescription medicines may be prescribed if over-the-counter medicines do not work.  HOME CARE INSTRUCTIONS   Eat foods that have a lot of fiber, such as fruits, vegetables, whole grains, and beans.  Limit foods high in fat and processed sugars, such as  french fries, hamburgers, cookies, candies, and soda.   A fiber supplement may be added to your diet if you cannot get enough fiber from foods.   Drink enough fluids to keep your urine clear or pale yellow.   Exercise regularly or as directed by your health care provider.   Go to the restroom when you have the urge to go. Do not hold it.   Only take over-the-counter or prescription medicines as directed by your health care provider. Do not take other medicines for constipation without talking to your health care provider first.  [place] IF:   You have bright red blood in your stool.   Your constipation lasts for more than 4 days or gets worse.   You have abdominal or rectal pain.   You have thin, pencil-like stools.   You have unexplained weight loss. MAKE SURE YOU:   Understand these instructions.  Will watch your condition.  Will get help right away if you are not doing well or get worse.   This information is not intended to replace advice given to you by your health care provider. Make sure you discuss any questions you have with your health care provider.   Document Released: 12/05/2003 Document Revised: 03/29/2014 Document Reviewed: 12/18/2012 Elsevier Interactive Patient Education [employer].

## 2015-10-30 NOTE — Op Note (Signed)
[facility] Patient Name: Joan Duncan Procedure Date: 10/30/2015 8:43 AM MRN: [id number] Date of Birth: May 29, 1960 Attending MD: Norvel Richards , MD CSN: [id number] Age: 55 Admit Type: Outpatient Procedure:                Colonoscopy with snare polypectomy Indications:              Colon cancer screening in patient at increased                            risk: Family history of 1st-degree relative with                            colon polyps Providers:                Norvel Richards, MD, Renda Rolls, RN, Randa Spike, Technician Referring MD:              Medicines:                Midazolam 8 mg IV, Meperidine 125 mg IV,                            Promethazine [id number] mg IV Complications:            No immediate complications. Estimated Blood Loss:     Estimated blood loss was minimal. Procedure:                Pre-Anesthesia Assessment:                           - Prior to the procedure, a History and Physical                            was performed, and patient medications and                            allergies were reviewed. The patient's tolerance of                            previous anesthesia was also reviewed. The risks                            and benefits of the procedure and the sedation                            options and risks were discussed with the patient.                            All questions were answered, and informed consent                            was obtained. Prior Anticoagulants: The patient has  taken no previous anticoagulant or antiplatelet                            agents. ASA Grade Assessment: II - A patient with                            mild systemic disease. After reviewing the risks                            and benefits, the patient was deemed in                            satisfactory condition to undergo the procedure.                           After obtaining  informed consent, the colonoscope                            was passed under direct vision. Throughout the                            procedure, the patient's blood pressure, pulse, and                            oxygen saturations were monitored continuously. The                            [id number] scope was introduced through                            the anus and advanced to the the cecum, identified                            by appendiceal orifice and ileocecal valve. The                            ileocecal valve, appendiceal orifice, and rectum                            were photographed. The colonoscopy was performed                            without difficulty. The patient tolerated the                            procedure well. The quality of the bowel                            preparation was adequate. Scope In: 9:11:45 AM Scope Out: 9:30:00 AM Scope Withdrawal Time: 0 hours 12 minutes 37 seconds  Total Procedure Duration: 0 hours 18 minutes 15 seconds  Findings:      The perianal and digital rectal examinations were normal.      A 4 mm polyp was found in the ascending colon.  The polyp was sessile.       The polyp was removed with a cold snare. Resection and retrieval were       complete. Estimated blood loss was minimal.      Internal hemorrhoids were found during retroflexion. The hemorrhoids       were Grade II (internal hemorrhoids that prolapse but reduce       spontaneously).      The exam was otherwise without abnormality on direct and retroflexion       views. Impression:               - One 4 mm polyp in the ascending colon, removed                            with a cold snare. Resected and retrieved.                           - Internal hemorrhoids.                           - The examination was otherwise normal on direct                            and retroflexion views. Moderate Sedation:      Moderate (conscious) sedation was administered by  the endoscopy nurse       and supervised by the endoscopist. The following parameters were       monitored: oxygen saturation, heart rate, blood pressure, respiratory       rate, EKG, adequacy of pulmonary ventilation, and response to care.       Total physician intraservice time was 37 minutes. Recommendation:           - Patient has a contact number available for                            emergencies. The signs and symptoms of potential                            delayed complications were discussed with the                            patient. Return to normal activities tomorrow.                            Written discharge instructions were provided to the                            patient.                           - Advance diet as tolerated.                           - Continue present medications.                           - Miralax 1 capful (17 grams) in 8 ounces of water  PO daily indefinitely.                           - Repeat colonoscopy date to be determined after                            pending pathology results are reviewed for                            surveillance based on pathology results.                           - Return to GI office in 3 months.                           - Repeat colonoscopy for surveillance based on                            pathology results. Patient may be a reasonably good                            hemorrhoid banding candidate. Procedure Code(s):        --- Professional ---                           [phone], Colonoscopy, flexible; with removal of                            tumor(s), polyp(s), or other lesion(s) by snare                            technique                           99152, Moderate sedation services provided by the                            same physician or other qualified health care                            professional performing the diagnostic or                            therapeutic  service that the sedation supports,                            requiring the presence of an independent trained                            observer to assist in the monitoring of the                            patient's level of consciousness and physiological  status; initial 15 minutes of intraservice time,                            patient age 96 years or older                           [phone], Moderate sedation services; each additional                            15 minutes intraservice time Diagnosis Code(s):        --- Professional ---                           Z83.71, Family history of colonic polyps                           D12.2, Benign neoplasm of ascending colon                           K64.1, Second degree hemorrhoids CPT copyright 2016 American Medical Association. All rights reserved. The codes documented in this report are preliminary and upon coder review may  be revised to meet current compliance requirements. Cristopher Estimable. Terrina Docter, MD Norvel Richards, MD 10/30/2015 9:41:59 AM This report has been signed electronically. Number of Addenda: 0

## 2015-11-03 ENCOUNTER — Encounter (HOSPITAL_COMMUNITY): Payer: Self-pay | Admitting: Internal Medicine

## 2015-11-04 ENCOUNTER — Encounter: Payer: Self-pay | Admitting: Internal Medicine

## 2016-01-30 ENCOUNTER — Ambulatory Visit: Payer: BC Managed Care – PPO | Admitting: Gastroenterology

## 2016-02-19 ENCOUNTER — Ambulatory Visit: Payer: BC Managed Care – PPO | Admitting: Gastroenterology

## 2016-06-16 ENCOUNTER — Telehealth: Payer: Self-pay | Admitting: *Deleted

## 2016-06-16 NOTE — Telephone Encounter (Signed)
Pt called stating she had an upcoming procedure and needed to know if she still had a cervix. She stated that Dr Glo Herring did surgery on her around 2001 and she couldn't remember. On reviewing her records it was noted that both uterus and cervix were absent. Informed patient who verbalized understanding.

## 2016-06-23 ENCOUNTER — Telehealth: Payer: Self-pay | Admitting: Gastroenterology

## 2016-06-23 NOTE — Telephone Encounter (Signed)
Printed previous Colon report and path report and placed on DOD AM Dr.Amrbruster's desk for review.

## 2016-06-24 ENCOUNTER — Encounter: Payer: Self-pay | Admitting: Gastroenterology

## 2016-06-24 NOTE — Telephone Encounter (Signed)
Dr.Armbruster reviewed records and accepted patient. Left message for patient to call back to schedule appointment. Records will be in records reviewed folder.

## 2016-08-02 ENCOUNTER — Ambulatory Visit: Payer: BC Managed Care – PPO | Admitting: Gastroenterology

## 2016-09-28 ENCOUNTER — Ambulatory Visit: Payer: BC Managed Care – PPO | Admitting: Gastroenterology

## 2017-01-18 ENCOUNTER — Other Ambulatory Visit (HOSPITAL_COMMUNITY): Payer: Self-pay | Admitting: Internal Medicine

## 2017-01-18 ENCOUNTER — Ambulatory Visit (HOSPITAL_COMMUNITY)
Admission: RE | Admit: 2017-01-18 | Discharge: 2017-01-18 | Disposition: A | Discharge status: Home / Self Care | Payer: BC Managed Care – PPO | Source: Ambulatory Visit | Attending: Internal Medicine | Admitting: Internal Medicine

## 2017-01-18 ENCOUNTER — Encounter (HOSPITAL_COMMUNITY): Payer: Self-pay

## 2017-01-18 DIAGNOSIS — R918 Other nonspecific abnormal finding of lung field: Secondary | ICD-10-CM | POA: Diagnosis not present

## 2017-01-18 DIAGNOSIS — R079 Chest pain, unspecified: Secondary | ICD-10-CM

## 2017-01-18 DIAGNOSIS — R0600 Dyspnea, unspecified: Secondary | ICD-10-CM | POA: Diagnosis present

## 2017-01-18 MED ORDER — IOPAMIDOL (ISOVUE-370) INJECTION 76%
75.0000 mL | Freq: Once | INTRAVENOUS | Status: AC | PRN
  Administered 2017-01-18: 75 mL via INTRAVENOUS

## 2017-01-21 ENCOUNTER — Emergency Department (HOSPITAL_COMMUNITY): Payer: BC Managed Care – PPO

## 2017-01-21 ENCOUNTER — Other Ambulatory Visit: Payer: Self-pay

## 2017-01-21 ENCOUNTER — Emergency Department (HOSPITAL_COMMUNITY)
Admission: EM | Admit: 2017-01-21 | Discharge: 2017-01-21 | Disposition: A | Discharge status: Home / Self Care | Payer: BC Managed Care – PPO | Attending: Emergency Medicine | Admitting: Emergency Medicine

## 2017-01-21 ENCOUNTER — Encounter (HOSPITAL_COMMUNITY): Payer: Self-pay | Admitting: Emergency Medicine

## 2017-01-21 DIAGNOSIS — Z7982 Long term (current) use of aspirin: Secondary | ICD-10-CM | POA: Insufficient documentation

## 2017-01-21 DIAGNOSIS — J9801 Acute bronchospasm: Secondary | ICD-10-CM

## 2017-01-21 DIAGNOSIS — R0602 Shortness of breath: Secondary | ICD-10-CM | POA: Diagnosis present

## 2017-01-21 DIAGNOSIS — Z87891 Personal history of nicotine dependence: Secondary | ICD-10-CM | POA: Insufficient documentation

## 2017-01-21 DIAGNOSIS — Z79899 Other long term (current) drug therapy: Secondary | ICD-10-CM | POA: Diagnosis not present

## 2017-01-21 LAB — CBC WITH DIFFERENTIAL/PLATELET
BASOS PCT: 1 %
Basophils Absolute: 0 10*3/uL (ref 0.0–0.1)
EOS ABS: 0.1 10*3/uL (ref 0.0–0.7)
Eosinophils Relative: 2 %
HCT: 40.2 % (ref 36.0–46.0)
HEMOGLOBIN: 13.2 g/dL (ref 12.0–15.0)
Lymphocytes Relative: 43 %
Lymphs Abs: 2.6 10*3/uL (ref 0.7–4.0)
MCH: 30.6 pg (ref 26.0–34.0)
MCHC: 32.8 g/dL (ref 30.0–36.0)
MCV: 93.1 fL (ref 78.0–100.0)
MONOS PCT: 8 %
Monocytes Absolute: 0.5 10*3/uL (ref 0.1–1.0)
NEUTROS PCT: 46 %
Neutro Abs: 2.9 10*3/uL (ref 1.7–7.7)
PLATELETS: 247 10*3/uL (ref 150–400)
RBC: 4.32 MIL/uL (ref 3.87–5.11)
RDW: 13.1 % (ref 11.5–15.5)
WBC: 6.2 10*3/uL (ref 4.0–10.5)

## 2017-01-21 LAB — COMPREHENSIVE METABOLIC PANEL
ALBUMIN: 4.5 g/dL (ref 3.5–5.0)
ALT: 22 U/L (ref 14–54)
ANION GAP: 12 (ref 5–15)
AST: 23 U/L (ref 15–41)
Alkaline Phosphatase: 103 U/L (ref 38–126)
BUN: 16 mg/dL (ref 6–20)
CHLORIDE: 105 mmol/L (ref 101–111)
CO2: 23 mmol/L (ref 22–32)
Calcium: 9.7 mg/dL (ref 8.9–10.3)
Creatinine, Ser: 0.86 mg/dL (ref 0.44–1.00)
GFR calc Af Amer: >60 mL/min (ref >60)
GFR calc non Af Amer: >60 mL/min (ref >60)
GLUCOSE: 97 mg/dL (ref 65–99)
POTASSIUM: 4 mmol/L (ref 3.5–5.1)
SODIUM: 140 mmol/L (ref 135–145)
Total Bilirubin: 0.6 mg/dL (ref 0.3–1.2)
Total Protein: 7.9 g/dL (ref 6.5–8.1)

## 2017-01-21 LAB — BRAIN NATRIURETIC PEPTIDE: B Natriuretic Peptide: 16 pg/mL (ref 0.0–100.0)

## 2017-01-21 LAB — TROPONIN I: Troponin I: <0.03 ng/mL (ref <0.03)

## 2017-01-21 MED ORDER — ALBUTEROL SULFATE HFA 108 (90 BASE) MCG/ACT IN AERS
1.0000 | INHALATION_SPRAY | RESPIRATORY_TRACT | Status: DC | PRN
  Administered 2017-01-21: 2 via RESPIRATORY_TRACT
  Filled 2017-01-21: qty 6.7

## 2017-01-21 MED ORDER — PREDNISONE 20 MG PO TABS: ORAL_TABLET | ORAL | 0 refills | Status: DC

## 2017-01-21 MED ORDER — ALBUTEROL SULFATE (2.5 MG/3ML) 0.083% IN NEBU
5.0000 mg | INHALATION_SOLUTION | Freq: Once | RESPIRATORY_TRACT | Status: AC
  Administered 2017-01-21: 5 mg via RESPIRATORY_TRACT
  Filled 2017-01-21: qty 6

## 2017-01-21 MED ORDER — METHYLPREDNISOLONE SODIUM SUCC 125 MG IJ SOLR
125.0000 mg | Freq: Once | INTRAMUSCULAR | Status: AC
  Administered 2017-01-21: 125 mg via INTRAMUSCULAR
  Filled 2017-01-21: qty 2

## 2017-01-21 NOTE — ED Notes (Signed)
Respiratory paged

## 2017-01-21 NOTE — ED Provider Notes (Signed)
[facility] EMERGENCY DEPARTMENT Provider Note   CSN: [id number] Arrival date & time: 01/21/17  1331     History   Chief Complaint Chief Complaint  Patient presents with  . Shortness of Breath    HPI Joan Duncan is a 56 y.o. female.  HPI Patient presents with 1 week of increased shortness of breath.  States she has shortness of breath especially with exertion.  Mild cough.  No fever or chills.  States she does have some throat tightness from time to time.  No new exposures though she does have 2 dogs that live in her house.  Had CT angiogram of the chest recently without evidence of PE.  Previous history of smoking but quit 5 years ago. Past Medical History:  Diagnosis Date  . Allergy   . Anxiety   . Arthritis   . Depression   . GERD (gastroesophageal reflux disease)   . IBS (irritable bowel syndrome)     Patient Active Problem List   Diagnosis Date Noted  . SUI (stress urinary incontinence), female 07/05/2014  . Mixed incontinence urge and stress 05/22/2014  . Postmenopausal atrophic vaginitis 05/22/2014    Past Surgical History:  Procedure Laterality Date  . ABDOMINAL HYSTERECTOMY    . CHOLECYSTECTOMY    . ROUX-EN-Y PROCEDURE      OB History    Gravida Para Term Preterm AB Living   2 2 2     2    SAB TAB Ectopic Multiple Live Births                   Home Medications    Prior to Admission medications   Medication Sig Start Date End Date Taking? Authorizing Provider  ALPRAZolam Duanne Moron) 1 MG tablet  04/27/14   [provider]  aspirin 81 MG tablet Take 81 mg by mouth daily.    [provider]  BIOTIN PO Take 1 tablet by mouth daily.    [provider]  cyclobenzaprine (FLEXERIL) 10 MG tablet Take 10 mg by mouth 3 (three) times daily as needed for muscle spasms.    [provider]  estradiol (ESTRACE) 1 MG tablet Take 1 tablet (1 mg total) by mouth daily. 05/22/14 05/22/15  Jonnie Kind, MD  loratadine (CLARITIN) 10 MG  tablet Take 10 mg by mouth daily.    [provider]  Multiple Vitamin (MULTIVITAMIN) tablet Take 1 tablet by mouth daily.    [provider]  Omega-3 Fatty Acids (FISH OIL PEARLS PO) Take 1 capsule by mouth daily.    [provider]  pantoprazole (PROTONIX) 40 MG tablet  05/20/14   [provider]  peg 3350 powder (MOVIPREP) 100 g SOLR Take 1 kit (200 g total) by mouth as directed. 10/02/15   Rourk, Cristopher Estimable, MD  predniSONE (DELTASONE) 20 MG tablet 3 tabs po day one, then 2 po daily x 4 days 01/22/17   Julianne Rice, MD  zolpidem (AMBIEN) 10 MG tablet Take 10 mg by mouth at bedtime as needed for sleep.    [provider]    Family History Family History  Problem Relation Age of Onset  . Stroke Mother   . Alzheimer's disease Mother   . Heart attack Brother     Social History Social History   Tobacco Use  . Smoking status: Former Smoker    Years: 40.00    Types: Cigarettes  . Smokeless tobacco: Never Used  . Tobacco comment: quit 3 years ago  Substance Use Topics  . Alcohol use: No  . Drug use: No     Allergies   Patient has no known allergies.   Review of Systems Review of Systems  Constitutional: Negative for chills and fever.  HENT: Negative for congestion, sinus pressure, sore throat and trouble swallowing.   Respiratory: Positive for cough, chest tightness and shortness of breath.   Cardiovascular: Positive for leg swelling. Negative for chest pain and palpitations.  Gastrointestinal: Negative for abdominal pain, constipation, diarrhea, nausea and vomiting.  Genitourinary: Negative for dysuria, flank pain and frequency.  Musculoskeletal: Negative for back pain, myalgias, neck pain and neck stiffness.  Skin: Negative for rash and wound.  Neurological: Negative for dizziness, weakness, light-headedness, numbness and headaches.  All other systems reviewed and are negative.    Physical Exam Updated Vital Signs BP 118/72    Pulse 88   Temp 98 F (36.7 C) (Oral)   Resp 18   Ht 5' 8"  (1.727 m)   Wt 83 kg (183 lb)   SpO2 99%   BMI 27.83 kg/m   Physical Exam  Constitutional: She is oriented to person, place, and time. She appears well-developed and well-nourished. No distress.  HENT:  Head: Normocephalic and atraumatic.  Mouth/Throat: Oropharynx is clear and moist. No oropharyngeal exudate.  Eyes: Pupils are equal, round, and reactive to light. EOM are normal.  Neck: Normal range of motion. Neck supple.  Cardiovascular: Normal rate and regular rhythm.  Exam reveals no gallop and no friction rub.   No murmur heard. Pulmonary/Chest: Effort normal.  Prolonged expiratory phase with scattered end expiratory wheezes  Abdominal: Soft. Bowel sounds are normal. There is no tenderness. There is no rebound and no guarding.  Musculoskeletal: Normal range of motion. She exhibits no edema or tenderness.  No lower extremity swelling, asymmetry or tenderness.  Neurological: She is alert and oriented to person, place, and time.  Moving all extremities without focal deficit.  Sensation fully intact.  Skin: Skin is warm and dry. Capillary refill takes less than 2 seconds. No rash noted. She is not diaphoretic. No erythema.  Psychiatric: She has a normal mood and affect. Her behavior is normal.  Nursing note and vitals reviewed.    ED Treatments / Results  Labs (all labs ordered are listed, but only abnormal results are displayed) Labs Reviewed  CBC WITH DIFFERENTIAL/PLATELET  COMPREHENSIVE METABOLIC PANEL  TROPONIN I  BRAIN NATRIURETIC PEPTIDE    EKG  EKG Interpretation  Date/Time:  Friday January 21 2017 13:51:12 EDT Ventricular Rate:  83 PR Interval:  150 QRS Duration: 78 QT Interval:  366 QTC Calculation: 430 R Axis:   54 Text Interpretation:  Normal sinus rhythm Possible Left atrial enlargement Borderline ECG Confirmed by Julianne Rice [phone]) on 01/21/2017 4:14:07 PM       Radiology No  results found.  Procedures Procedures (including critical care time)  Medications Ordered in ED Medications  methylPREDNISolone sodium succinate (SOLU-MEDROL) 125 mg/2 mL injection 125 mg (125 mg Intramuscular Given 01/21/17 1613)  albuterol (PROVENTIL) (2.5 MG/3ML) 0.083% nebulizer solution 5 mg (5 mg Nebulization Given 01/21/17 1628)     Initial Impression / Assessment and Plan / ED Course  I have reviewed the triage vital signs and the nursing notes.  Pertinent labs & imaging results that were available during my care of the patient were reviewed by me and considered in my medical decision making (see chart for details).    Patient says she is feeling much better after breathing  treatment.  Patient likely has some residual bronchospasm from recent bronchitis.  Will give short course of steroids and given albuterol inhaler in the emergency department.  Advised to follow-up with her primary physician.  May need pulmonary referral if her symptoms persist.  Final Clinical Impressions(s) / ED Diagnoses   Final diagnoses:  Bronchospasm    New Prescriptions This SmartLink is deprecated. Use AVSMEDLIST instead to display the medication list for a patient.   Julianne Rice, MD 01/23/17 1759

## 2017-01-21 NOTE — ED Triage Notes (Signed)
Patient c/o shortness of breath x1 month. Patient seen by Dr Gerarda Fraction a week ago and had chest CT and EKG. Patient told that she had chest pleurisy and given steriod injection. Patient denies any improvement and states that Dr Gerarda Fraction sent her here to ER to be evaluated. Denies any fevers. Non-productive cough.

## 2017-06-14 ENCOUNTER — Other Ambulatory Visit (HOSPITAL_COMMUNITY): Payer: Self-pay | Admitting: Internal Medicine

## 2017-06-14 DIAGNOSIS — R109 Unspecified abdominal pain: Secondary | ICD-10-CM

## 2017-06-30 ENCOUNTER — Ambulatory Visit (HOSPITAL_COMMUNITY)
Admission: RE | Admit: 2017-06-30 | Discharge: 2017-06-30 | Disposition: A | Discharge status: Home / Self Care | Payer: BC Managed Care – PPO | Source: Ambulatory Visit | Attending: Internal Medicine | Admitting: Internal Medicine

## 2017-06-30 DIAGNOSIS — I700 Atherosclerosis of aorta: Secondary | ICD-10-CM | POA: Diagnosis not present

## 2017-06-30 DIAGNOSIS — M47816 Spondylosis without myelopathy or radiculopathy, lumbar region: Secondary | ICD-10-CM | POA: Insufficient documentation

## 2017-06-30 DIAGNOSIS — R109 Unspecified abdominal pain: Secondary | ICD-10-CM

## 2017-06-30 DIAGNOSIS — M160 Bilateral primary osteoarthritis of hip: Secondary | ICD-10-CM | POA: Insufficient documentation

## 2017-06-30 MED ORDER — IOPAMIDOL (ISOVUE-300) INJECTION 61%
100.0000 mL | Freq: Once | INTRAVENOUS | Status: AC | PRN
  Administered 2017-06-30: 100 mL via INTRAVENOUS

## 2018-09-13 ENCOUNTER — Other Ambulatory Visit: Payer: Self-pay

## 2018-09-13 ENCOUNTER — Ambulatory Visit
Admission: EM | Admit: 2018-09-13 | Discharge: 2018-09-13 | Disposition: A | Discharge status: Home / Self Care | Payer: BC Managed Care – PPO | Attending: Emergency Medicine | Admitting: Emergency Medicine

## 2018-09-13 ENCOUNTER — Ambulatory Visit (INDEPENDENT_AMBULATORY_CARE_PROVIDER_SITE_OTHER): Payer: BC Managed Care – PPO

## 2018-09-13 DIAGNOSIS — M7918 Myalgia, other site: Secondary | ICD-10-CM

## 2018-09-13 DIAGNOSIS — M25551 Pain in right hip: Secondary | ICD-10-CM

## 2018-09-13 DIAGNOSIS — M79641 Pain in right hand: Secondary | ICD-10-CM | POA: Diagnosis not present

## 2018-09-13 DIAGNOSIS — M79644 Pain in right finger(s): Secondary | ICD-10-CM

## 2018-09-13 DIAGNOSIS — W19XXXA Unspecified fall, initial encounter: Secondary | ICD-10-CM | POA: Diagnosis not present

## 2018-09-13 NOTE — ED Triage Notes (Signed)
Pt fell at work today fell on right hand hand and right hip , no swelling or deformity noted

## 2018-09-13 NOTE — ED Provider Notes (Signed)
[place]   [id number] 09/13/18 Arrival Time: 6568  CC:RT thumb and buttock pain  SUBJECTIVE: History from: patient. Joan Duncan is a 58 y.o. female hx significant for allergies, anxiety, arthritis, depression, GERD, and IBS, complains of right thumb and buttock pain that began earlier today.  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  Localizes the pain to the right thumb and right buttock.  Pain is constant and dull. Has tried OTC aleve with relief.  Symptoms are made worse with thumb ROM and sitting on buttock.  Reports previous low back and hip pain.  Denies fever, chills, erythema, ecchymosis, effusion, weakness, numbness and tingling, saddle paresthesias, loss of bowel or bladder function.      ROS: As per HPI.  Past Medical History:  Diagnosis Date   Allergy    Anxiety    Arthritis    Depression    GERD (gastroesophageal reflux disease)    IBS (irritable bowel syndrome)    Past Surgical History:  Procedure Laterality Date   ABDOMINAL HYSTERECTOMY     CHOLECYSTECTOMY     COLONOSCOPY N/A 10/30/2015   Procedure: COLONOSCOPY;  Surgeon: Daneil Dolin, MD;  Location: [facility];  Service: Endoscopy;  Laterality: N/A;  830   POLYPECTOMY  10/30/2015   Procedure: POLYPECTOMY;  Surgeon: Daneil Dolin, MD;  Location: [facility];  Service: Endoscopy;;  Ascending colon polyp removed via cold snare   ROUX-EN-Y PROCEDURE     No Known Allergies No current facility-administered medications on file prior to encounter.    Current Outpatient Medications on File Prior to Encounter  Medication Sig Dispense Refill   ALPRAZolam (XANAX) 1 MG tablet   2   aspirin 81 MG tablet Take 81 mg by mouth daily.     BIOTIN PO Take 1 tablet by mouth daily.     cyclobenzaprine (FLEXERIL) 10 MG tablet Take 10 mg by mouth 3  (three) times daily as needed for muscle spasms.     loratadine (CLARITIN) 10 MG tablet Take 10 mg by mouth daily.     Multiple Vitamin (MULTIVITAMIN) tablet Take 1 tablet by mouth daily.     Omega-3 Fatty Acids (FISH OIL PEARLS PO) Take 1 capsule by mouth daily.     pantoprazole (PROTONIX) 40 MG tablet      zolpidem (AMBIEN) 10 MG tablet Take 10 mg by mouth at bedtime as needed for sleep.     [DISCONTINUED] estradiol (ESTRACE) 1 MG tablet Take 1 tablet (1 mg total) by mouth daily. 30 tablet 11   Social History   Socioeconomic History   Marital status: Married    Spouse name: Not on file   Number of children: Not on file   Years of education: Not on file   Highest education level: Not on file  Occupational History   Not on file  Social Needs   Financial resource strain: Not on file   Food insecurity    Worry: Not on file    Inability: Not on file   Transportation needs    Medical: Not on file    Non-medical: Not on file  Tobacco Use   Smoking status: Former Smoker    Years: 40.00    Types: Cigarettes   Smokeless tobacco: Never Used   Tobacco comment: quit 3 years ago  Substance and Sexual  Activity   Alcohol use: No   Drug use: No   Sexual activity: Not Currently  Lifestyle   Physical activity    Days per week: Not on file    Minutes per session: Not on file   Stress: Not on file  Relationships   Social connections    Talks on phone: Not on file    Gets together: Not on file    Attends religious service: Not on file    Active member of club or organization: Not on file    Attends meetings of clubs or organizations: Not on file    Relationship status: Not on file   Intimate partner violence    Fear of current or ex partner: Not on file    Emotionally abused: Not on file    Physically abused: Not on file    Forced sexual activity: Not on file  Other Topics Concern   Not on file  Social History Narrative   Not on file   Family History    Problem Relation Age of Onset   Stroke Mother    Alzheimer's disease Mother    Heart attack Brother     OBJECTIVE:  Vitals:   09/13/18 1558  BP: (!) 155/88  Pulse: 72  Resp: 18  Temp: 98.2 F (36.8 C)    General appearance: ALERT; in no acute distress.  Head: NCAT Lungs: Normal respiratory effort CV: radial pulse 2+. Cap refill < 2 seconds Musculoskeletal:  Right hand Inspection: Skin warm, dry, clear and intact without obvious erythema, effusion, or ecchymosis.   Palpation: TTP over first MCP joint and proximal phalange; no snuff box tenderness ROM: FROM active and passive Strength: 5/5 shld abduction, 5/5 shld adduction, 5/5 elbow flexion, 5/5 elbow extension, 5/5 grip strength Right hip Inspection: Skin warm, dry, clear and intact without obvious erythema, effusion, or ecchymosis.   Palpation: TTP over the RT buttock and greater sciatic notch; no midline tenderness ROM: FROM active and passive Strength: 5/5 hip flexion, 5/5 knee abduction, 5/5 knee adduction, 5/5 knee flexion, 5/5 knee extension +Faber test Skin: warm and dry Neurologic: Ambulates without difficulty; Sensation intact about the upper/ lower extremities Psychological: alert and cooperative; normal mood and affect  DIAGNOSTIC STUDIES:  Dg Hand Complete Right  Result Date: 09/13/2018 CLINICAL DATA:  Right hand pain after fall. EXAM: RIGHT HAND - COMPLETE 3+ VIEW COMPARISON:  None. FINDINGS: No acute fracture or dislocation. Mild osteoarthritis of the thumb IP joint, second through fifth DIP joints, second and third MCP joints, and distal radioulnar joint. Moderate osteoarthritis of the scaphotrapeziotrapezoid joint. Bone mineralization is normal. Soft tissues are unremarkable. IMPRESSION: 1.  No acute osseous abnormality. 2. Scattered mild-to-moderate osteoarthritis throughout the hand. Electronically Signed   By: Titus Dubin M.D.   On: 09/13/2018 16:57   Dg Hip Unilat With Pelvis 2-3 Views  Right  Result Date: 09/13/2018 CLINICAL DATA:  Right hip pain after fall. EXAM: DG HIP (WITH OR WITHOUT PELVIS) 2-3V RIGHT COMPARISON:  CT abdomen pelvis dated June 30, 2017. FINDINGS: No acute fracture or dislocation. Mild-to-moderate right hip joint space narrowing with prominent marginal osteophytes. Bone mineralization is normal. Soft tissues are unremarkable. IMPRESSION: 1.  No acute osseous abnormality. 2. 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 Electronically Signed   By: Titus Dubin M.D.   On: 09/13/2018 16:54     ASSESSMENT & PLAN:  1. Right buttock pain   2. Fall   3. Pain of right thumb     No orders  of the defined types were placed in this encounter.  X-rays did not show fracture or dislocation, symptoms most likely musculoskeletal in nature Thumb spica placed Continue conservative management of rest, ice, and elevation Limit painful activities Take OTC aleve as needed for pain relief (may cause abdominal discomfort, ulcers, and GI bleeds avoid taking with other NSAIDs) Follow up with PCP or occupation health if symptoms persist Return or go to the ER if you have any new or worsening symptoms (fever, chills, chest pain, abdominal pain, changes in bowel or bladder habits, pain radiating into lower legs, etc...)   Reviewed expectations re: course of current medical issues. Questions answered. Outlined signs and symptoms indicating need for more acute intervention. Patient verbalized understanding. After Visit Summary given.    Lestine Box, PA-C 09/13/18 1735

## 2018-09-13 NOTE — Discharge Instructions (Addendum)
X-rays did not show fracture or dislocation, symptoms most likely musculoskeletal in nature Thumb spica placed Continue conservative management of rest, ice, and elevation Limit painful activities Take OTC aleve as needed for pain relief (may cause abdominal discomfort, ulcers, and GI bleeds avoid taking with other NSAIDs) Follow up with PCP or occupation health if symptoms persist Return or go to the ER if you have any new or worsening symptoms (fever, chills, chest pain, abdominal pain, changes in bowel or bladder habits, pain radiating into lower legs, etc...)

## 2019-01-26 ENCOUNTER — Encounter: Payer: Self-pay | Admitting: Internal Medicine

## 2019-02-13 ENCOUNTER — Other Ambulatory Visit: Payer: Self-pay

## 2019-02-13 ENCOUNTER — Encounter: Payer: Self-pay | Admitting: Internal Medicine

## 2019-02-13 ENCOUNTER — Ambulatory Visit: Payer: BC Managed Care – PPO | Admitting: Internal Medicine

## 2019-02-13 VITALS — BP 134/75 | HR 68 | Temp 97.6°F | Ht 68.0 in | Wt 186.0 lb

## 2019-02-13 DIAGNOSIS — R1310 Dysphagia, unspecified: Secondary | ICD-10-CM | POA: Diagnosis not present

## 2019-02-13 DIAGNOSIS — K219 Gastro-esophageal reflux disease without esophagitis: Secondary | ICD-10-CM | POA: Diagnosis not present

## 2019-02-13 DIAGNOSIS — R1319 Other dysphagia: Secondary | ICD-10-CM

## 2019-02-13 NOTE — Progress Notes (Signed)
Primary Care Physician:  Joan School, MD Primary Gastroenterologist:  Dr. Gala Romney  Pre-Procedure History & Physical: HPI:  Joan Duncan is a 58 y.o. female here for further evaluation of refractory reflux symptoms.  States has had reflux for over 10 years.  Currently on Protonix 40 mg twice daily.  Gets "choked" from time to time.  Coughs and feels things do not always go down sometimes has to "cough food up".  No prior food impaction.  Reflux symptoms at night in spite of taking Protonix 40 mg twice daily.  Former smoker.  No prior EGD. She reminded me that she had a complicated cholecystectomy up at [facility] in 1997.  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  Spent 2 weeks in the hospital. She had a CT scan for abdominal pain in 2017 which revealed DJD in the hip and back but no significant GI findings aside from a small hiatal hernia.  Family history colon polyps and a personal history of colon polyps.  Small adenoma removed from her colon 2017; slated for surveillance examination 2022.  She denies any lower GI tract symptoms.  Has not had any bleeding.  Denies abdominal pain. She does take Celebrex.  Past Medical History:  Diagnosis Date  . Allergy   . Anxiety   . Arthritis   . Depression   . GERD (gastroesophageal reflux disease)   . IBS (irritable bowel syndrome)     Past Surgical History:  Procedure Laterality Date  . ABDOMINAL HYSTERECTOMY    . CHOLECYSTECTOMY    . COLONOSCOPY N/A 10/30/2015   Procedure: COLONOSCOPY;  Surgeon: Daneil Dolin, MD;  Location: [facility];  Service: Endoscopy;  Laterality: N/A;  830  . POLYPECTOMY  10/30/2015   Procedure: POLYPECTOMY;  Surgeon: Daneil Dolin, MD;  Location: [facility];  Service: Endoscopy;;  Ascending colon polyp removed via cold snare  . ROUX-EN-Y PROCEDURE      Prior to Admission medications   Medication Sig Start Date End Date Taking? Authorizing Provider  albuterol (VENTOLIN HFA) 108 (90 Base)  MCG/ACT inhaler as needed. 01/13/19  Yes [provider]  ALPRAZolam Duanne Moron) 1 MG tablet Take 1 mg by mouth at bedtime as needed.  04/27/14  Yes [provider]  aspirin 81 MG tablet Take 81 mg by mouth daily.   Yes [provider]  BIOTIN PO Take 1 tablet by mouth daily.   Yes [provider]  celecoxib (CELEBREX) 100 MG capsule Take 100 mg by mouth 2 (two) times daily as needed. 02/02/19  Yes [provider]  cyclobenzaprine (FLEXERIL) 10 MG tablet Take 10 mg by mouth 3 (three) times daily as needed for muscle spasms.   Yes [provider]  loratadine (CLARITIN) 10 MG tablet Take 10 mg by mouth daily.   Yes [provider]  Multiple Vitamin (MULTIVITAMIN) tablet Take 1 tablet by mouth daily.   Yes [provider]  Omega-3 Fatty Acids (FISH OIL PEARLS PO) Take 1 capsule by mouth daily.   Yes [provider]  pantoprazole (PROTONIX) 40 MG tablet Take 40 mg by mouth 2 (two) times daily.  05/20/14  Yes [provider]  zolpidem (AMBIEN) 10 MG tablet Take 10 mg by mouth at bedtime as needed for sleep.   Yes [provider]  estradiol (ESTRACE) 1 MG tablet Take 1 tablet (1 mg total) by mouth daily. 05/22/14 09/13/18  Jonnie Kind, MD    Allergies as of 02/13/2019  . (  No Known Allergies)    Family History  Problem Relation Age of Onset  . Stroke Mother   . Alzheimer's disease Mother   . Heart attack Brother     Social History   Socioeconomic History  . Marital status: Married    Spouse name: Not on file  . Number of children: Not on file  . Years of education: Not on file  . Highest education level: Not on file  Occupational History  . Not on file  Social Needs  . Financial resource strain: Not on file  . Food insecurity    Worry: Not on file    Inability: Not on file  . Transportation needs    Medical: Not on file    Non-medical: Not on file  Tobacco Use  . Smoking status: Former  Smoker    Years: 40.00    Types: Cigarettes  . Smokeless tobacco: Never Used  . Tobacco comment: quit 3 years ago  Substance and Sexual Activity  . Alcohol use: No  . Drug use: No  . Sexual activity: Not Currently  Lifestyle  . Physical activity    Days per week: Not on file    Minutes per session: Not on file  . Stress: Not on file  Relationships  . Social Herbalist on phone: Not on file    Gets together: Not on file    Attends religious service: Not on file    Active member of club or organization: Not on file    Attends meetings of clubs or organizations: Not on file    Relationship status: Not on file  . Intimate partner violence    Fear of current or ex partner: Not on file    Emotionally abused: Not on file    Physically abused: Not on file    Forced sexual activity: Not on file  Other Topics Concern  . Not on file  Social History Narrative  . Not on file    Review of Systems: See HPI, otherwise negative ROS  Physical Exam: BP 134/75   Pulse 68   Temp 97.6 F (36.4 C) (Oral)   Ht 5\' 8"  (1.727 m)   Wt 186 lb (84.4 kg)   BMI 28.28 kg/m  General:   Alert,  Well-developed, well-nourished, pleasant and cooperative in NAD Heart:  Regular rate and rhythm; no murmurs, clicks, rubs,  or gallops. Abdomen: Non-distended, normal bowel sounds.  Soft and nontender without appreciable mass or hepatosplenomegaly.  Pulses:  Normal pulses noted. Extremities:  Without clubbing or edema.  Impression/Plan: Long, longstanding reflux symptoms in this nice 58 year old lady.  Twice daily PPI helps but she continues to have breakthrough symptoms  -  now with vague esophageal dysphagia.  Former smoker.  I agree with Dr. Gerarda Fraction, she certainly needs an EGD to further evaluate.  She may need her esophagus dilated.  History history of colonic adenoma; due for surveillance colonoscopy 2022.  I see no reason to change those plans at this time.  Recommendations: I have offered  the patient an EGD with esophageal dilation as feasible/appropriate.   The risks, benefits, limitations, alternatives and imponderables have been reviewed with the patient. Potential for esophageal dilation, biopsy, etc. have also been reviewed.  Questions have been answered. All parties agreeable.  GERD information provided.  Continue Protonix 40 mg twice daily for now - but recommendations may change  Use Pepsid Complete - one to 2 tablets in the evening for flare in reflux  symptoms  Plan for a colonoscopy in 2022  Further recommendations to follow       Notice: This dictation was prepared with Dragon dictation along with smaller phrase technology. Any transcriptional errors that result from this process are unintentional and may not be corrected upon review.

## 2019-02-13 NOTE — Patient Instructions (Addendum)
GERD information provided  Schedule an EGD with esophageal dilation (refractory GERD and esophageal dysphagia) - Propofol.  Continue Protonix 40 mg twice daily for now - but recommendations may change  Use Pepsid Complete - one to 2 tablets in the evening for flare in reflux symptoms  Plan for a colonoscopy in 2022  Further recommendations to follow

## 2019-04-05 ENCOUNTER — Other Ambulatory Visit: Payer: Self-pay

## 2019-04-05 ENCOUNTER — Ambulatory Visit: Payer: BC Managed Care – PPO | Attending: Internal Medicine

## 2019-04-05 DIAGNOSIS — Z20822 Contact with and (suspected) exposure to covid-19: Secondary | ICD-10-CM

## 2019-04-06 LAB — NOVEL CORONAVIRUS, NAA: SARS-CoV-2, NAA: NOT DETECTED

## 2019-04-23 ENCOUNTER — Telehealth: Payer: Self-pay | Admitting: *Deleted

## 2019-04-23 NOTE — Telephone Encounter (Signed)
Pt called to cancel her EGD with Propofol.  She requested to reschedule to May/June.  Pt is aware that our schedules aren't available at this time but we will call her back to reschedule.  Pt voiced understanding.  Kim in Endo was already aware.  Pt had called her earlier.

## 2019-05-08 ENCOUNTER — Other Ambulatory Visit (HOSPITAL_COMMUNITY): Payer: BC Managed Care – PPO

## 2019-05-21 ENCOUNTER — Telehealth: Payer: Self-pay

## 2019-05-21 NOTE — Telephone Encounter (Signed)
Tried to call pt to reschedule EGD/DIL w/Prop w/RMR, no answer, LMOVM for return call.

## 2019-05-22 ENCOUNTER — Telehealth: Payer: Self-pay | Admitting: Internal Medicine

## 2019-05-22 NOTE — Telephone Encounter (Signed)
Pt was returning a call. She is asking for her procedure to be scheduled during the week of Easter or either on a Wednesday. 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 She said that she would call back tomorrow to verify everything. [phone]

## 2019-05-22 NOTE — Telephone Encounter (Signed)
LMOVM for pt 

## 2019-05-22 NOTE — Telephone Encounter (Signed)
See prior note by MB.

## 2019-05-22 NOTE — Telephone Encounter (Signed)
Tried to call pt, no answer, LMOVM for return call.  

## 2019-05-22 NOTE — Telephone Encounter (Signed)
Spoke with pt. She is scheduled for 5/27 at 7:30am. Patient aware will mail instructions with pre-op/covid test appt.  Called endo and made aware of appt change.

## 2019-05-22 NOTE — Telephone Encounter (Signed)
PATIENT CALLED ABOUT RESCHEDULING HER ENDO    WANTED TO WAIT UNTIL MAY SCHEDULE CAME OUT

## 2019-06-09 ENCOUNTER — Ambulatory Visit: Payer: BC Managed Care – PPO | Attending: Internal Medicine

## 2019-06-09 DIAGNOSIS — Z23 Encounter for immunization: Secondary | ICD-10-CM

## 2019-06-09 NOTE — Progress Notes (Signed)
   Covid-19 Vaccination Clinic  Name:  Joan Duncan    MRN: [id number] DOB: 1960/12/16  06/09/2019  [name] was observed post Covid-19 immunization for 15 minutes without incident. She was provided with Vaccine Information Sheet and instruction to access the V-Safe system.   [name] was instructed to call 911 with any severe reactions post vaccine: Marland Kitchen Difficulty breathing  . Swelling of face and throat  . A fast heartbeat  . A bad rash all over body  . Dizziness and weakness   Immunizations Administered    Name Date Dose VIS Date Route   Moderna COVID-19 Vaccine 06/09/2019  9:13 AM 0.5 mL 02/20/2019 Intramuscular   Manufacturer: Moderna   Lot: [id number]   AvonVO:[id number]

## 2019-06-21 ENCOUNTER — Encounter: Payer: Self-pay | Admitting: *Deleted

## 2019-06-21 ENCOUNTER — Telehealth: Payer: Self-pay

## 2019-06-21 NOTE — Telephone Encounter (Signed)
Spoke with pt. She has rescheduled x 1 before. She is now on the schedule for 7/12 at 10:45am. Patient aware will mail new instructions with new covid/preop appt. Called endo and LMOVM making aware of appt change.

## 2019-06-21 NOTE — Telephone Encounter (Signed)
Pt needs to r/s her EGD due to work. Please call pt [phone]. Pt is at work and can call back if message is left. Pt will need an apt at the end of June or summer due to work schedule change.

## 2019-07-11 ENCOUNTER — Ambulatory Visit: Payer: BC Managed Care – PPO | Attending: Internal Medicine

## 2019-07-11 DIAGNOSIS — Z23 Encounter for immunization: Secondary | ICD-10-CM

## 2019-07-11 NOTE — Progress Notes (Signed)
   Covid-19 Vaccination Clinic  Name:  Joan Duncan    MRN: [id number] DOB: 12/21/1960  07/11/2019  [name] was observed post Covid-19 immunization for 15 minutes without incident. She was provided with Vaccine Information Sheet and instruction to access the V-Safe system.   [name] was instructed to call 911 with any severe reactions post vaccine: Marland Kitchen Difficulty breathing  . Swelling of face and throat  . A fast heartbeat  . A bad rash all over body  . Dizziness and weakness   Immunizations Administered    Name Date Dose VIS Date Route   Moderna COVID-19 Vaccine 07/11/2019 10:45 AM 0.5 mL 02/2019 Intramuscular   Manufacturer: Moderna   Lot: [id number]   [id number]

## 2019-07-17 ENCOUNTER — Other Ambulatory Visit: Payer: Self-pay | Admitting: Family Medicine

## 2019-07-17 DIAGNOSIS — Z1231 Encounter for screening mammogram for malignant neoplasm of breast: Secondary | ICD-10-CM

## 2019-08-14 ENCOUNTER — Other Ambulatory Visit (HOSPITAL_COMMUNITY): Payer: BC Managed Care – PPO

## 2019-08-29 ENCOUNTER — Telehealth: Payer: Self-pay | Admitting: Internal Medicine

## 2019-08-29 NOTE — Telephone Encounter (Signed)
Pt is scheduled for EGD with propofol with RMR on 7/12 and wants to reschedule to a Monday in September. [phone]

## 2019-08-29 NOTE — Telephone Encounter (Signed)
Called pt. She has not been seen since 01/2019. Patient has already cancelled prior. Aware she will need OV. She states she will call back to schedule OV. I have called endo and cancelled procedure.

## 2019-09-28 ENCOUNTER — Other Ambulatory Visit (HOSPITAL_COMMUNITY): Payer: BC Managed Care – PPO

## 2019-10-01 ENCOUNTER — Ambulatory Visit (HOSPITAL_COMMUNITY)
Admission: RE | Admit: 2019-10-01 | Payer: BC Managed Care – PPO | Source: Home / Self Care | Admitting: Internal Medicine

## 2019-10-01 ENCOUNTER — Encounter (HOSPITAL_COMMUNITY): Admission: RE | Payer: Self-pay | Source: Home / Self Care

## 2019-10-01 SURGERY — ESOPHAGOGASTRODUODENOSCOPY (EGD) WITH PROPOFOL
Anesthesia: Monitor Anesthesia Care

## 2020-03-04 IMAGING — CT CT ABD-PELV W/ CM
2 of 5 series · 16 of 46 positions shown, 18 images · IV contrast (Isovue)
Comparison: 04/23/2011

CLINICAL DATA: Right-sided back pain and abdominal pain for 2-3
years

EXAM:
CT ABDOMEN AND PELVIS WITH CONTRAST
TECHNIQUE: Multidetector CT imaging of the abdomen and pelvis was performed
using the standard protocol following bolus administration of
intravenous contrast.
CONTRAST:  100mL H7E7MH-4AA IOPAMIDOL (H7E7MH-4AA) INJECTION 61%

[Series 2: axial st · axial · 0.68mm/px · z∈[+936,+1356]mm · 13 of 96 slices shown, 15 images]
[im 6/96  soft-tissue]
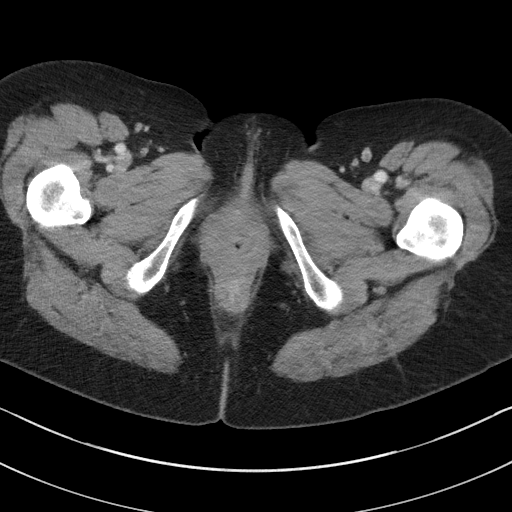
[im 6/96  bone]
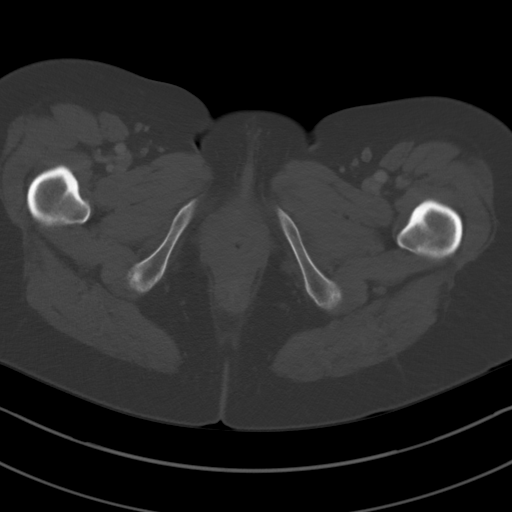
[im 12/96  soft-tissue]
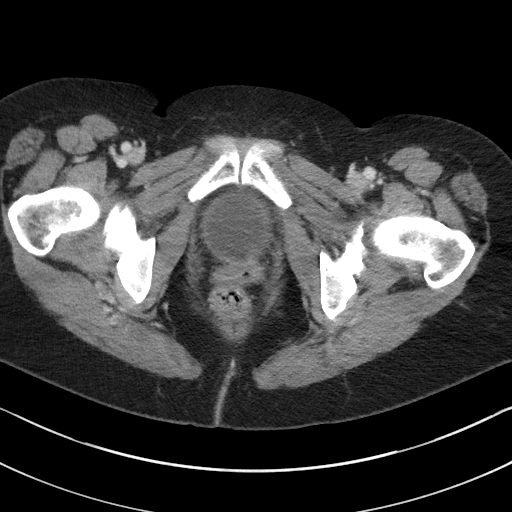
[im 23/96  soft-tissue]
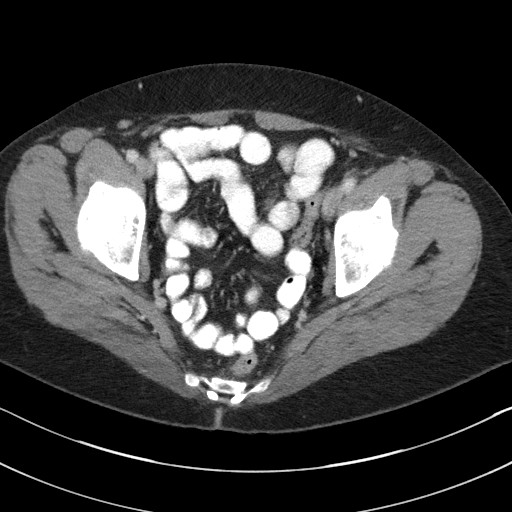
[im 28/96  soft-tissue]
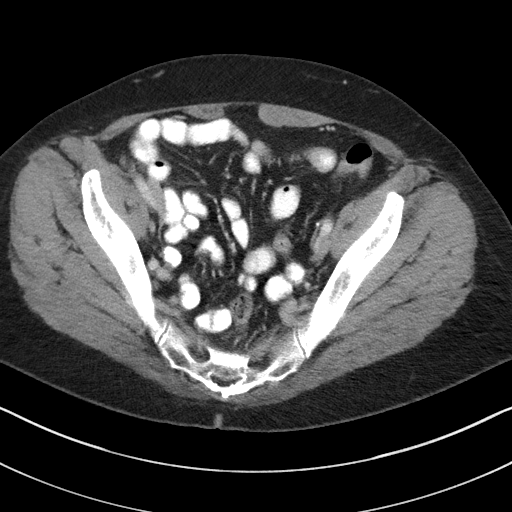
[im 34/96  soft-tissue]
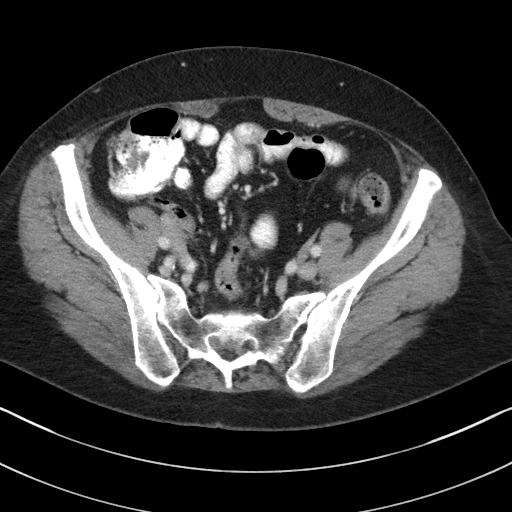
[im 40/96  soft-tissue]
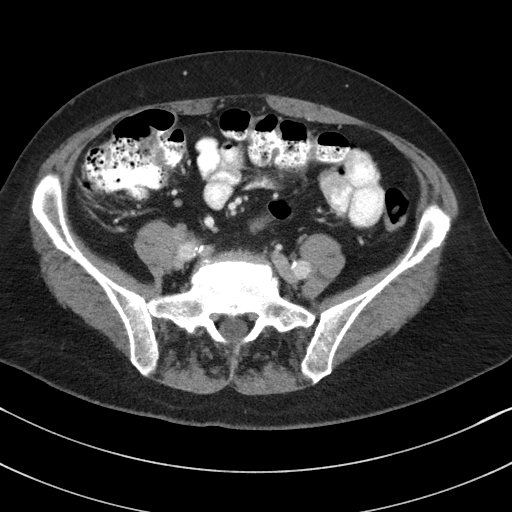
[im 51/96  soft-tissue]
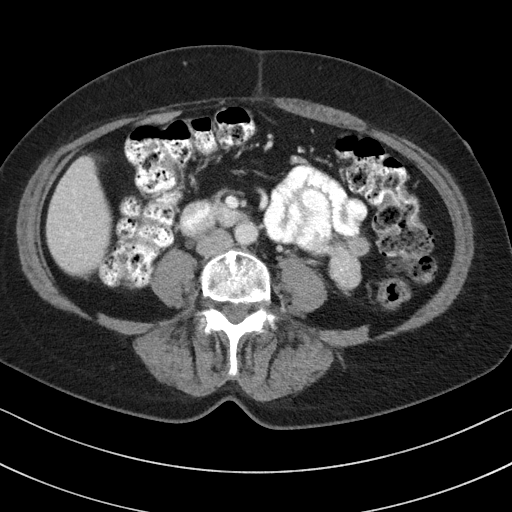
[im 56/96  soft-tissue]
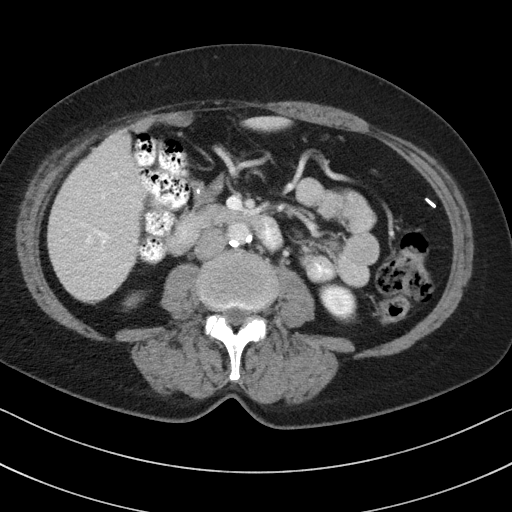
[im 62/96  soft-tissue]
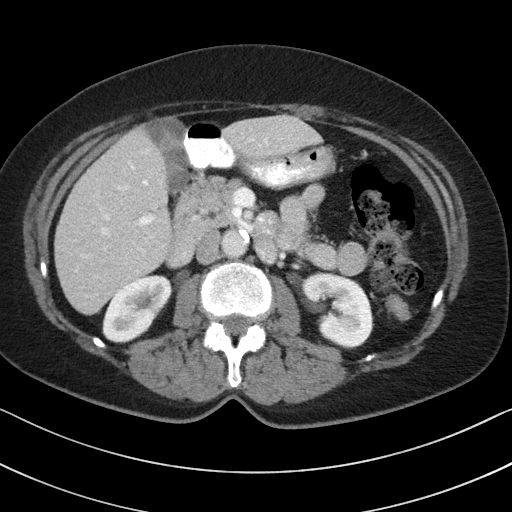
[im 62/96  bone]
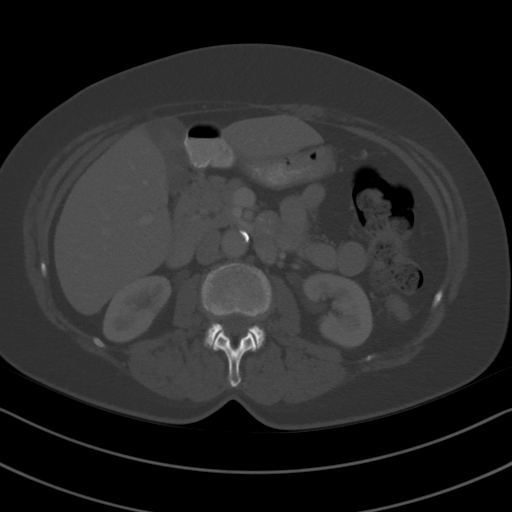
[im 68/96  soft-tissue]
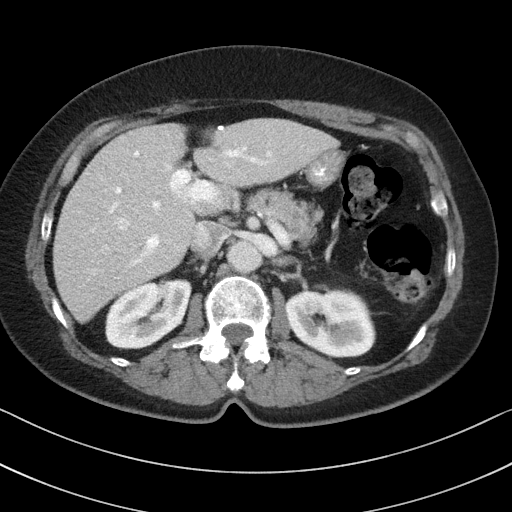
[im 73/96  soft-tissue]
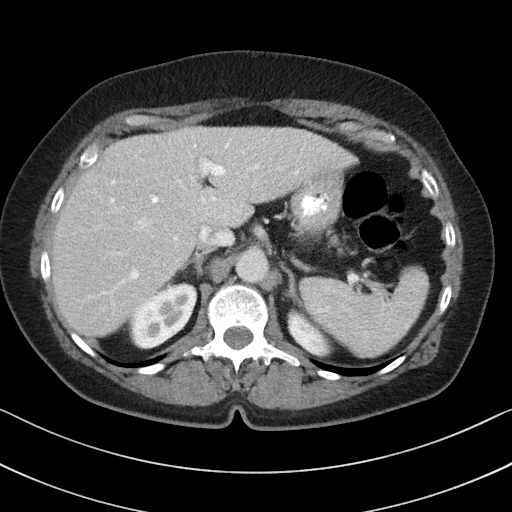
[im 84/96  soft-tissue]
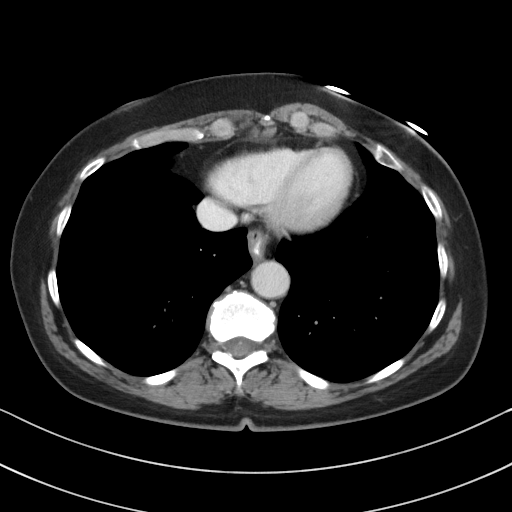
[im 90/96  soft-tissue]
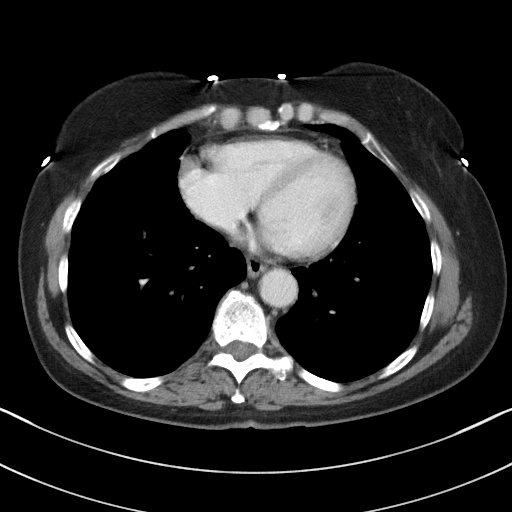

[Series 5: coronal st · coronal · 0.77mm/px · 3 of 106 slices shown]
[im 36/106  soft-tissue]
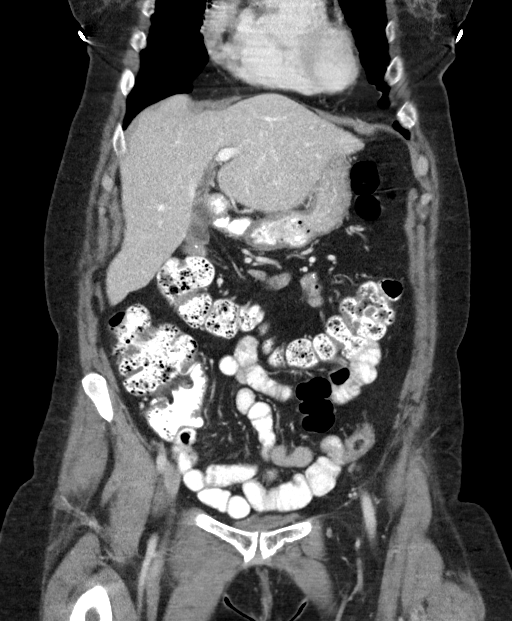
[im 47/106  soft-tissue]
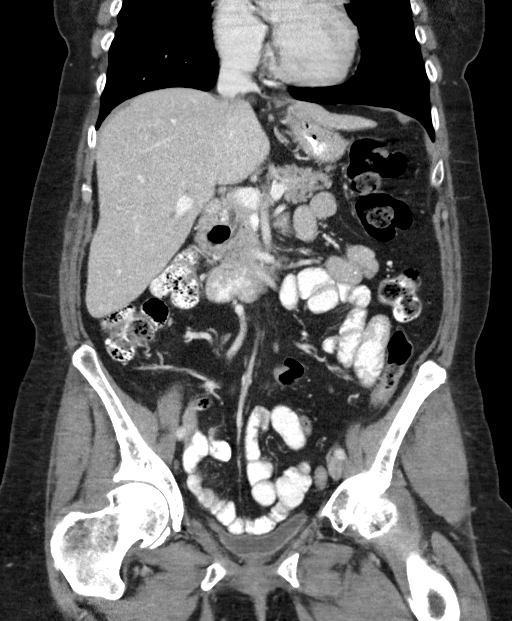
[im 59/106  soft-tissue]
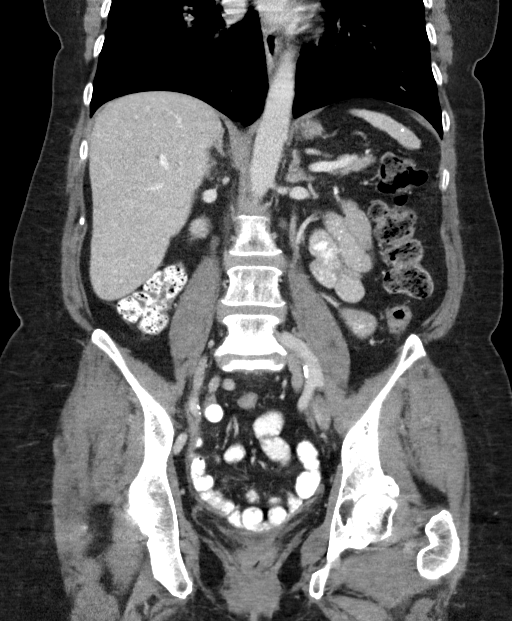

[16 of 46 positions shown; findings below may reference images not displayed]

FINDINGS: Lower chest: No acute abnormality.

Hepatobiliary: No focal liver abnormality is seen. Prior
cholecystectomy. Tubular fluid-filled structure in the gallbladder
fossa similar to the prior examination likely reflecting loops of
small bowel.

Pancreas: Unremarkable. No pancreatic ductal dilatation or
surrounding inflammatory changes.

Spleen: Normal in size without focal abnormality.

Adrenals/Urinary Tract: Adrenal glands are unremarkable. Kidneys are
normal, without renal calculi, focal lesion, or hydronephrosis.
Bladder is unremarkable.

Stomach/Bowel: Small hiatal hernia. Stomach is otherwise within
normal limits. Appendix appears normal. No evidence of bowel wall
thickening, distention, or inflammatory changes. Tubular
fluid-filled structure in the gallbladder fossa similar to the prior
examination likely reflecting loops of small bowel.

Vascular/Lymphatic: Abdominal aortic atherosclerosis. Normal caliber
abdominal aorta. No lymphadenopathy.

Reproductive: Status post hysterectomy. No adnexal masses.

Other: No abdominal wall hernia or abnormality. No abdominopelvic
ascites.

Musculoskeletal: No acute osseous abnormality. No aggressive osseous
lesion. Degenerative disc disease disc height loss at L5-S1. Minimal
grade 1 anterolisthesis of L4 on L5. Bilateral facet arthropathy at
L3-4, L4-5 and L5-S1. Moderate-severe osteoarthritis of the right
hip. Mild osteoarthritis of the left hip.
IMPRESSION: 1. No acute abdominal or pelvic pathology.
2. Lumbar spine spondylosis as described above.
3. Moderate-severe osteoarthritis of the right hip. Mild
osteoarthritis of the left hip.
4.  Aortic Atherosclerosis ([id number]-940.0).

## 2020-10-10 ENCOUNTER — Encounter: Payer: Self-pay | Admitting: *Deleted

## 2021-05-18 IMAGING — DX DG HIP (WITH OR WITHOUT PELVIS) 2-3V RIGHT
2 series · 2 of 2 positions shown · non-contrast
Comparison: CT abdomen pelvis dated June 30, 2017.

CLINICAL DATA: Right hip pain after fall.

EXAM:
DG HIP (WITH OR WITHOUT PELVIS) 2-3V RIGHT

[hip ap]
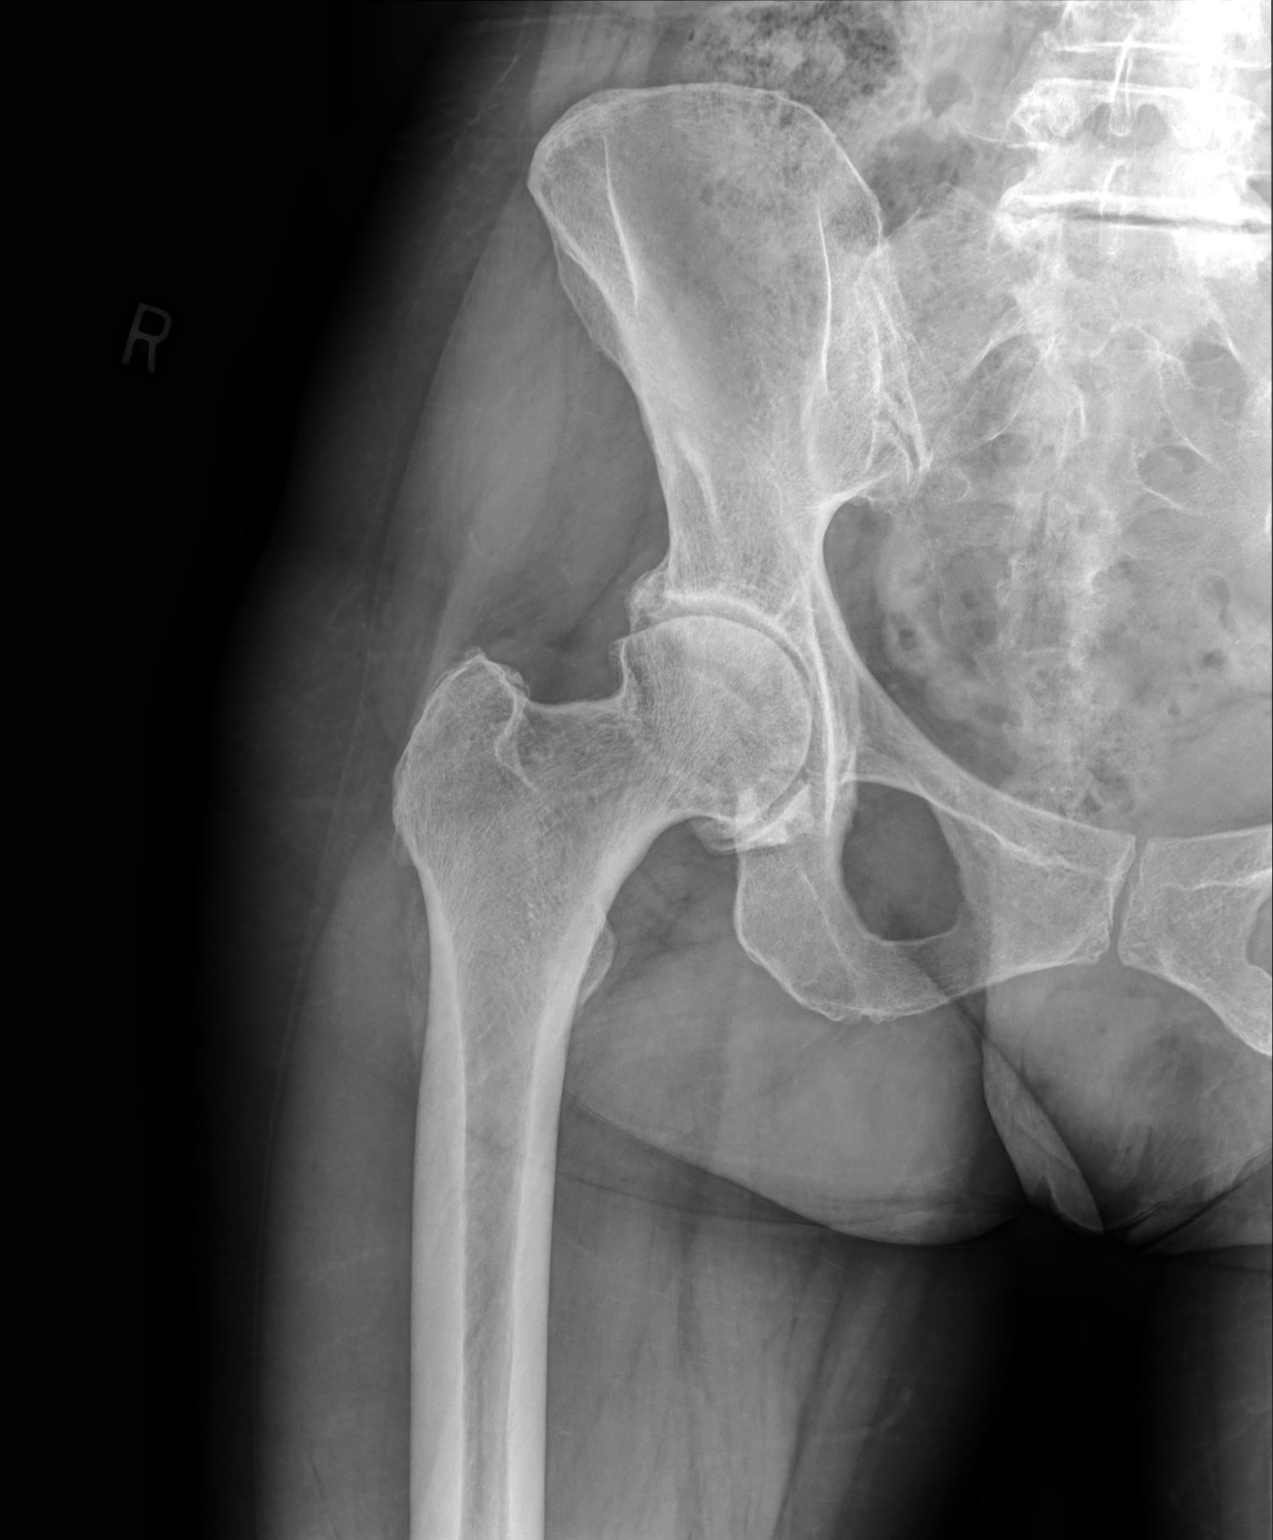

[hip (frog leg)]
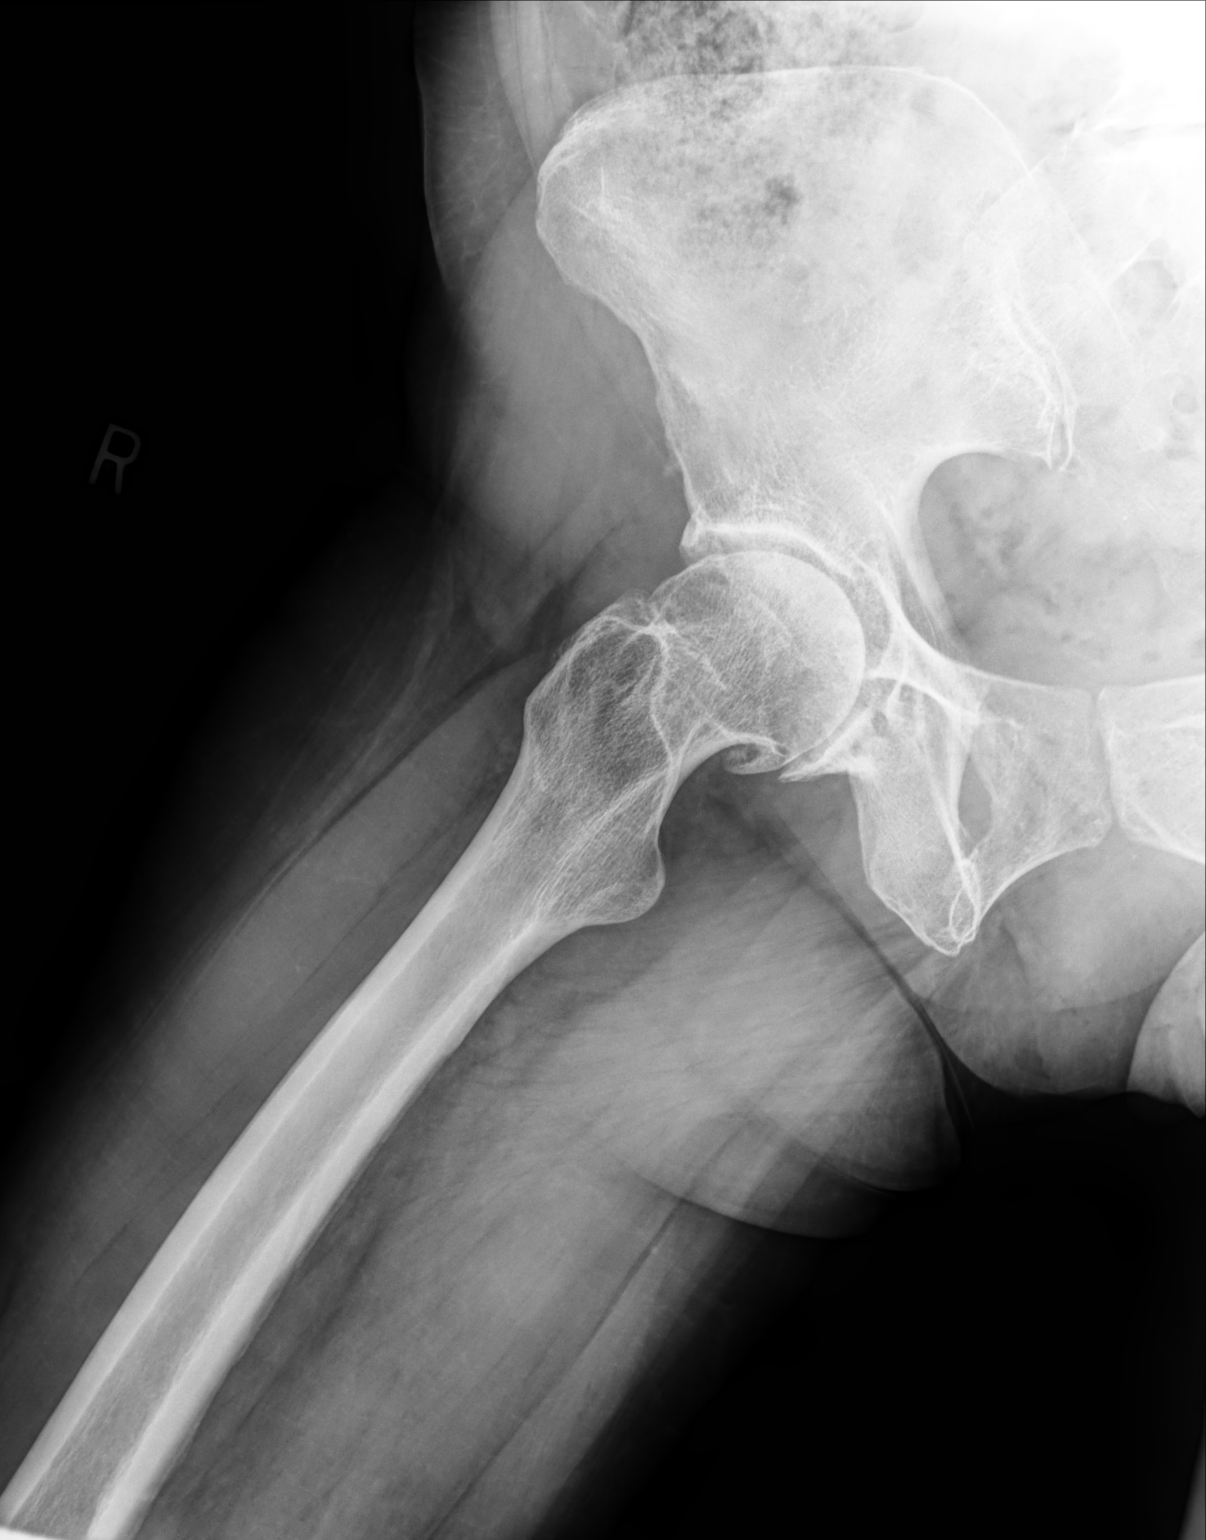

[2 of 2 positions shown; findings below may reference images not displayed]

FINDINGS: No acute fracture or dislocation. Mild-to-moderate right hip joint
space narrowing with prominent marginal osteophytes. Bone
mineralization is normal. Soft tissues are unremarkable.
IMPRESSION: 1.  No acute osseous abnormality.
2. Mild-to-moderate right hip osteoarthritis.

## 2021-12-10 ENCOUNTER — Ambulatory Visit: Payer: BC Managed Care – PPO | Admitting: Internal Medicine

## 2022-01-14 ENCOUNTER — Ambulatory Visit
Admission: EM | Admit: 2022-01-14 | Discharge: 2022-01-14 | Disposition: A | Discharge status: Home / Self Care | Payer: BC Managed Care – PPO

## 2022-01-14 ENCOUNTER — Encounter: Payer: Self-pay | Admitting: *Deleted

## 2022-01-14 ENCOUNTER — Other Ambulatory Visit: Payer: Self-pay

## 2022-01-14 DIAGNOSIS — R0602 Shortness of breath: Secondary | ICD-10-CM

## 2022-01-14 DIAGNOSIS — Z8659 Personal history of other mental and behavioral disorders: Secondary | ICD-10-CM | POA: Diagnosis not present

## 2022-01-14 DIAGNOSIS — Z8709 Personal history of other diseases of the respiratory system: Secondary | ICD-10-CM | POA: Diagnosis not present

## 2022-01-14 LAB — POCT FASTING CBG KUC MANUAL ENTRY: POCT Glucose (KUC): 97 mg/dL (ref 70–99)

## 2022-01-14 NOTE — ED Triage Notes (Signed)
Pt states she started coughing yesterday and used her MDI once but she started with chest tightness and SOB. She says she bursitis in her left shoulder and has been mopping a lot at work recently. So she doesn't know if the left arm tingling is due to her work. She does have a diagnosis COPD.   She stats she is very anxious since she lives alone and she does know if she needs to see someone for her lungs but she does have an appt with cardio soon.

## 2022-01-14 NOTE — Discharge Instructions (Addendum)
Your exam and EKG today were very reassuring.  It is possible that your shortness of breath is a combination of anxiety and your underlying lung issues.  We have sent out for some basic labs to make sure that we rule out some other possible causes of your shortness of breath.  If your symptoms significantly worsen at any time please go to the emergency department for further evaluation.

## 2022-01-14 NOTE — ED Provider Notes (Signed)
[facility]    CSN: [id number] Arrival date & time: 01/14/22  0845      History   Chief Complaint Chief Complaint  Patient presents with   Shortness of Breath    HPI Joan Duncan is a 61 y.o. female.   Patient presenting today with an episode of shortness of breath that occurred yesterday, much milder symptoms today.  She states that her chest felt tight yesterday and she felt like she was having trouble catching her breath throughout the day regardless of if she was being active or not but worse with activity.  She denies chest pain, palpitations, dizziness, syncope, headaches.  Does also have some left shoulder pain and soreness but this is not new and she equates that to doing a lot of mopping at work.  She has a history of anxiety on Xanax as needed, COPD on albuterol as needed.  She does state her job is made her very anxious lately but she was not feeling particularly anxious or panicked yesterday while symptoms were going on.  So far not trying anything other than her albuterol which she states did not provide any relief and possibly made her more anxious due to the jittery side effect.   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   Postmenopausal atrophic vaginitis 05/22/2014   Past Surgical History:  Procedure Laterality Date   ABDOMINAL HYSTERECTOMY     CHOLECYSTECTOMY     COLONOSCOPY N/A 10/30/2015   Procedure: COLONOSCOPY;  Surgeon: Daneil Dolin, MD;  [facility];  Service: Endoscopy;  Laterality: N/A;  830   POLYPECTOMY  10/30/2015   Procedure: POLYPECTOMY;  Surgeon: Daneil Dolin, MD;  Location: [facility];  Service: Endoscopy;;  Ascending colon polyp  removed via cold snare   ROUX-EN-Y PROCEDURE     OB History     Gravida  2   Para  2   Term  2   Preterm      AB      Living  2      SAB      IAB      Ectopic      Multiple      Live Births               Home Medications    Prior to Admission medications   Medication Sig Start Date End Date Taking? Authorizing Provider  albuterol (VENTOLIN HFA) 108 (90 Base) MCG/ACT inhaler as needed. 01/13/19  Yes [provider]  ALPRAZolam Duanne Moron) 0.5 MG tablet Take 0.5 mg by mouth 3 (three) times daily as needed. 12/23/21  Yes [provider]  aspirin 81 MG tablet Take 81 mg by mouth daily.   Yes [provider]  BIOTIN PO Take 1 tablet by mouth daily.   Yes [provider]  loratadine (CLARITIN) 10 MG tablet Take 10 mg by mouth daily.   Yes [provider]  Multiple Vitamin (MULTIVITAMIN) tablet Take 1 tablet by mouth daily.   Yes [provider]  pantoprazole (PROTONIX) 40 MG tablet Take 40 mg by mouth 2 (two) times daily.  05/20/14  Yes [provider]  rosuvastatin (CRESTOR) 10 MG tablet Take 10 mg by mouth daily. 11/02/21  Yes [provider]  zolpidem (AMBIEN) 10 MG tablet Take 10 mg by mouth at bedtime as needed for sleep.   Yes [provider]  ALPRAZolam Duanne Moron) 1 MG tablet Take 1 mg by mouth at bedtime as needed.  04/27/14   [provider]  celecoxib (CELEBREX) 100 MG capsule Take 100 mg by mouth 2 (two) times daily as needed. 02/02/19   [provider]  cyclobenzaprine (FLEXERIL) 10 MG tablet Take 10 mg by mouth 3 (three) times daily as needed for muscle spasms.    [provider]  Omega-3 Fatty Acids (FISH OIL PEARLS PO) Take 1 capsule by mouth daily.    [provider]  estradiol (ESTRACE) 1 MG tablet Take 1 tablet (1 mg total) by mouth daily. 05/22/14 09/13/18  Jonnie Kind, MD    Family History Family History  Problem Relation Age of Onset    Stroke Mother    Alzheimer's disease Mother    Heart attack Brother     Social History Social History   Tobacco Use   Smoking status: Former    Years: 40.00    Types: Cigarettes   Smokeless tobacco: Never   Tobacco comments:    quit 3 years ago  Vaping Use   Vaping Use: Never used  Substance Use Topics   Alcohol use: No   Drug use: No     Allergies   Patient has no known allergies.   Review of Systems Review of Systems PER HPI  Physical Exam Triage Vital Signs ED Triage Vitals  Enc Vitals Group     BP 01/14/22 0914 (!) 165/97     Pulse Rate 01/14/22 0914 64     Resp 01/14/22 0914 18     Temp 01/14/22 0914 98 F (36.7 C)     Temp Source 01/14/22 0914 Oral     SpO2 01/14/22 0914 100 %     Weight --      Height --      Head Circumference --      Peak Flow --      Pain Score 01/14/22 0912 0     Pain Loc --      Pain Edu? --      Excl. in [place]? --    No data found.  Updated Vital Signs BP 136/86 (BP Location: Right Arm)   Pulse 68   Temp 97.8 F (36.6 C) (Oral)   Resp 18   SpO2 98%   Visual Acuity Right Eye Distance:   Left Eye Distance:   Bilateral Distance:    Right Eye Near:   Left Eye Near:    Bilateral Near:     Physical Exam Vitals and nursing note reviewed.  Constitutional:      Appearance: Normal appearance. She is not ill-appearing.  HENT:     Head: Atraumatic.     Mouth/Throat:     Mouth: Mucous membranes are moist.     Pharynx: Oropharynx is clear.  Eyes:     Extraocular Movements: Extraocular movements intact.     Conjunctiva/sclera: Conjunctivae normal.  Cardiovascular:     Rate and Rhythm: Normal rate and regular rhythm.     Heart sounds: Normal heart sounds.  Pulmonary:     Effort: Pulmonary effort is normal. No respiratory distress.     Breath sounds: Normal breath sounds. No wheezing or rales.  Musculoskeletal:  General: Normal range of motion.     Cervical back: Normal range of motion and neck supple.  Skin:     General: Skin is warm and dry.  Neurological:     Mental Status: She is alert and oriented to person, place, and time.     Motor: No weakness.     Gait: Gait normal.  Psychiatric:        Mood and Affect: Mood normal.        Thought Content: Thought content normal.        Judgment: Judgment normal.      UC Treatments / Results  Labs (all labs ordered are listed, but only abnormal results are displayed) Labs Reviewed  POCT FASTING CBG [facility] MANUAL ENTRY - Normal  CBC WITH DIFFERENTIAL/PLATELET  COMPREHENSIVE METABOLIC PANEL    EKG   Radiology No results found.  Procedures Procedures (including critical care time)  Medications Ordered in UC Medications - No data to display  Initial Impression / Assessment and Plan / UC Course  I have reviewed the triage vital signs and the nursing notes.  Pertinent labs & imaging results that were available during my care of the patient were reviewed by me and considered in my medical decision making (see chart for details).     Vital signs and exam benign and very reassuring today with no obvious concerning findings or abnormalities.  Her EKG shows normal sinus rhythm at 63 bpm without acute ST or T wave changes.  Her point-of-care glucose is within normal limits at 97 and random, labs pending for further evaluation and rule out.  Did discuss that we are unable to fully rule out with certainty any emergent event such as a heart attack in the setting but she declines going to the emergency department for any further evaluation and was happy with the reassurance given.  Continue to monitor, go to the emergency department if symptoms significantly worsening at any time.  Final Clinical Impressions(s) / UC Diagnoses   Final diagnoses:  SOB (shortness of breath)  History of COPD  History of anxiety     Discharge Instructions      Your exam and EKG today were very reassuring.  It is possible that your shortness of breath is a  combination of anxiety and your underlying lung issues.  We have sent out for some basic labs to make sure that we rule out some other possible causes of your shortness of breath.  If your symptoms significantly worsen at any time please go to the emergency department for further evaluation.    ED Prescriptions   None    PDMP not reviewed this encounter.   Volney American, [place] 01/14/22 1535

## 2022-01-15 LAB — CBC WITH DIFFERENTIAL/PLATELET
Basophils Absolute: 0.1 10*3/uL (ref 0.0–0.2)
Basos: 1 %
EOS (ABSOLUTE): 0.1 10*3/uL (ref 0.0–0.4)
Eos: 2 %
Hematocrit: 39.2 % (ref 34.0–46.6)
Hemoglobin: 13.2 g/dL (ref 11.1–15.9)
Immature Grans (Abs): 0 10*3/uL (ref 0.0–0.1)
Immature Granulocytes: 0 %
Lymphocytes Absolute: 1.8 10*3/uL (ref 0.7–3.1)
Lymphs: 30 %
MCH: 31.4 pg (ref 26.6–33.0)
MCHC: 33.7 g/dL (ref 31.5–35.7)
MCV: 93 fL (ref 79–97)
Monocytes Absolute: 0.3 10*3/uL (ref 0.1–0.9)
Monocytes: 4 %
Neutrophils Absolute: 3.8 10*3/uL (ref 1.4–7.0)
Neutrophils: 63 %
Platelets: 199 10*3/uL (ref 150–450)
RBC: 4.21 x10E6/uL (ref 3.77–5.28)
RDW: 12.9 % (ref 11.7–15.4)
WBC: 6.1 10*3/uL (ref 3.4–10.8)

## 2022-01-15 LAB — COMPREHENSIVE METABOLIC PANEL
ALT: 15 IU/L (ref 0–32)
AST: 19 IU/L (ref 0–40)
Albumin/Globulin Ratio: 1.9 (ref 1.2–2.2)
Albumin: 4.5 g/dL (ref 3.9–4.9)
Alkaline Phosphatase: 100 IU/L (ref 44–121)
BUN/Creatinine Ratio: 15 (ref 12–28)
BUN: 12 mg/dL (ref 8–27)
Bilirubin Total: 0.5 mg/dL (ref 0.0–1.2)
CO2: 22 mmol/L (ref 20–29)
Calcium: 9.3 mg/dL (ref 8.7–10.3)
Chloride: 101 mmol/L (ref 96–106)
Creatinine, Ser: 0.8 mg/dL (ref 0.57–1.00)
Globulin, Total: 2.4 g/dL (ref 1.5–4.5)
Glucose: 94 mg/dL (ref 70–99)
Potassium: 4 mmol/L (ref 3.5–5.2)
Sodium: 137 mmol/L (ref 134–144)
Total Protein: 6.9 g/dL (ref 6.0–8.5)
eGFR: 83 mL/min/{1.73_m2} (ref >59)

## 2022-02-05 ENCOUNTER — Other Ambulatory Visit
Admission: RE | Admit: 2022-02-05 | Discharge: 2022-02-05 | Disposition: A | Discharge status: Home / Self Care | Payer: BC Managed Care – PPO | Source: Ambulatory Visit | Attending: Internal Medicine | Admitting: Internal Medicine

## 2022-02-05 ENCOUNTER — Ambulatory Visit: Payer: BC Managed Care – PPO | Admitting: Internal Medicine

## 2022-02-05 ENCOUNTER — Encounter: Payer: Self-pay | Admitting: Internal Medicine

## 2022-02-05 ENCOUNTER — Encounter: Payer: Self-pay | Admitting: *Deleted

## 2022-02-05 VITALS — BP 138/78 | HR 78 | Ht 68.0 in | Wt 194.0 lb

## 2022-02-05 DIAGNOSIS — R0989 Other specified symptoms and signs involving the circulatory and respiratory systems: Secondary | ICD-10-CM

## 2022-02-05 DIAGNOSIS — R079 Chest pain, unspecified: Secondary | ICD-10-CM

## 2022-02-05 DIAGNOSIS — Z13220 Encounter for screening for lipoid disorders: Secondary | ICD-10-CM | POA: Diagnosis not present

## 2022-02-05 DIAGNOSIS — E785 Hyperlipidemia, unspecified: Secondary | ICD-10-CM | POA: Insufficient documentation

## 2022-02-05 LAB — BASIC METABOLIC PANEL
Anion gap: 9 (ref 5–15)
BUN: 17 mg/dL (ref 8–23)
CO2: 24 mmol/L (ref 22–32)
Calcium: 9.2 mg/dL (ref 8.9–10.3)
Chloride: 105 mmol/L (ref 98–111)
Creatinine, Ser: 0.78 mg/dL (ref 0.44–1.00)
GFR, Estimated: >60 mL/min (ref >60)
Glucose, Bld: 98 mg/dL (ref 70–99)
Potassium: 4.5 mmol/L (ref 3.5–5.1)
Sodium: 138 mmol/L (ref 135–145)

## 2022-02-05 MED ORDER — METOPROLOL TARTRATE 100 MG PO TABS: 100.0000 mg | ORAL_TABLET | ORAL | 0 refills | Status: DC

## 2022-02-05 NOTE — Patient Instructions (Signed)
Medication Instructions:  Your physician recommends that you continue on your current medications as directed. Please refer to the Current Medication list given to you today.  Take Lopressor 100 mg  two Hours prior to CT Scan   *If you need a refill on your cardiac medications before your next appointment, please call your pharmacy*   Lab Work: Your physician recommends that you return for lab work in: Today   If you have labs (blood work) drawn today and your tests are completely normal, you will receive your results only by: [place] (if you have MyChart) OR A paper copy in the mail If you have any lab test that is abnormal or we need to change your treatment, we will call you to review the results.   Testing/Procedures: Your physician has requested that you have a carotid duplex. This test is an ultrasound of the carotid arteries in your neck. It looks at blood flow through these arteries that supply the brain with blood. Allow one hour for this exam. There are no restrictions or special instructions.     Your cardiac CT will be scheduled at one of the below locations:   [facility] [address] [place], [place] [address] (336) [place] [address] [place], [place] [address] [phone]  [facility] [address] [place], [place] [address] [phone]  If scheduled at [facility], please arrive at the [facility] (Entrance C2) of [facility] 30 minutes prior to test start time. You can use the FREE valet parking offered at entrance C (encouraged to control the heart rate for the test)  Proceed to the [facility] Radiology Department (first floor) to check-in and test prep.  All radiology patients and guests should use entrance C2 at [facility], accessed from [facility], even though the hospital's  physical address listed is [address].    If scheduled at [facility] or [facility], please arrive 15 mins early for check-in and test prep.   Please follow these instructions carefully (unless otherwise directed):  On the Night Before the Test: Be sure to Drink plenty of water. Do not consume any caffeinated/decaffeinated beverages or chocolate 12 hours prior to your test. Do not take any antihistamines 12 hours prior to your test.  On the Day of the Test: Drink plenty of water until 1 hour prior to the test. Do not eat any food 1 hour prior to test. You may take your regular medications prior to the test.  Take metoprolol (Lopressor) two hours prior to test. HOLD Furosemide/Hydrochlorothiazide morning of the test. FEMALES- please wear underwire-free bra if available, avoid dresses & tight clothing   *For Clinical Staff only. Please instruct patient the following:* Heart Rate Medication Recommendations for Cardiac CT  Resting HR < 50 bpm  No medication  Resting HR 50-60 bpm and BP >110/50 mmHG   Consider Metoprolol tartrate 25 mg PO 90-120 min prior to scan  Resting HR 60-65 bpm and BP >110/50 mmHG  Metoprolol tartrate 50 mg PO 90-120 minutes prior to scan   Resting HR > 65 bpm and BP >110/50 mmHG  Metoprolol tartrate 100 mg PO 90-120 minutes prior to scan  Consider Ivabradine 10-15 mg PO or a calcium channel blocker for resting HR >60 bpm and contraindication to metoprolol tartrate  Consider Ivabradine 10-15 mg  PO in combination with metoprolol tartrate for HR >80 bpm         After the Test: Drink plenty of water. After receiving IV contrast, you may experience a mild flushed feeling. This is normal. On occasion, you may experience a mild rash up to 24 hours after the test. This is not dangerous. If this occurs, you can take Benadryl 25 mg and increase your fluid intake. If you experience trouble breathing, this  can be serious. If it is severe call 911 IMMEDIATELY. If it is mild, please call our office. If you take any of these medications: Glipizide/Metformin, Avandament, Glucavance, please do not take 48 hours after completing test unless otherwise instructed.  We will call to schedule your test 2-4 weeks out understanding that some insurance companies will need an authorization prior to the service being performed.   For non-scheduling related questions, please contact the cardiac imaging nurse navigator should you have any questions/concerns: Marchia Bond, Cardiac Imaging Nurse Navigator Gordy Clement, Cardiac Imaging Nurse Navigator [facility] Direct Office Dial: [phone]   For scheduling needs, including cancellations and rescheduling, please call [place], [phone].    Follow-Up: At [facility], you and your health needs are our priority.  As part of our continuing mission to provide you with exceptional heart care, we have created designated Provider Care Teams.  These Care Teams include your primary Cardiologist (physician) and Advanced Practice Providers (APPs -  Physician Assistants and Nurse Practitioners) who all work together to provide you with the care you need, when you need it.  We recommend signing up for the patient portal called "MyChart".  Sign up information is provided on this After Visit Summary.  MyChart is used to connect with patients for Virtual Visits (Telemedicine).  Patients are able to view lab/test results, encounter notes, upcoming appointments, etc.  Non-urgent messages can be sent to your provider as well.   To learn more about what you can do with MyChart, go to [email or web address].    Your next appointment:    As Needed   The format for your next appointment:   In Person  Provider:   Dorris Carnes, MD    Other Instructions Thank you for choosing [facility]!    Important Information About  [place]

## 2022-02-05 NOTE — Progress Notes (Signed)
Cardiology Office Note   Date:  02/05/2022   ID:  Joan Duncan, DOB Nov 14, 1960, MRN [id number]  PCP:  Redmond School, MD  Cardiologist:   Dorris Carnes, MD   Pt referred for chest tightness   History of Present Illness: Joan Duncan is a 61 y.o. female with a history of SOB  Seen in [facility] in October    Felt tightnes in chest the day prior  Trouble catching breath    Patient says the spell scared her.   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   aspirin 81 MG tablet Take 81 mg by mouth daily.   BIOTIN PO Take 1 tablet by mouth daily.   co-enzyme Q-10 30 MG capsule Take 30 mg by mouth daily.   loratadine (CLARITIN) 10 MG tablet Take 10 mg by mouth daily.   Multiple Vitamin (MULTIVITAMIN) tablet Take 1 tablet by mouth daily.   pantoprazole (PROTONIX) 40 MG tablet Take 40 mg by mouth 2 (two) times daily.    rosuvastatin (CRESTOR) 10 MG tablet Take 10 mg by mouth daily.   zolpidem (AMBIEN) 10 MG tablet Take 10 mg by mouth at bedtime as needed for sleep.     Allergies:   Patient has no known allergies.   Past Medical History:  Diagnosis Date   Allergy    Anxiety    Arthritis    Depression    GERD (gastroesophageal reflux disease)    IBS (irritable bowel  syndrome)     Past Surgical History:  Procedure Laterality Date   ABDOMINAL HYSTERECTOMY     CHOLECYSTECTOMY     COLONOSCOPY N/A 10/30/2015   Procedure: COLONOSCOPY;  Surgeon: Daneil Dolin, MD;  [facility];  Service: Endoscopy;  Laterality: N/A;  830   POLYPECTOMY  10/30/2015   Procedure: POLYPECTOMY;  Surgeon: Daneil Dolin, MD;  Location: [facility];  Service: Endoscopy;;  Ascending colon polyp removed via cold snare   ROUX-EN-Y PROCEDURE       Social History:  The patient  reports that she has quit smoking. Her smoking use included cigarettes. She has never used smokeless tobacco. She reports that she does not drink alcohol and does not use drugs.   Family History:  The patient's family history includes Alzheimer's disease in her mother; Heart attack in her brother; Stroke in her mother.    ROS:  Please see the history of present illness. All other systems are reviewed and  Negative to the above problem except as noted.  PHYSICAL EXAM: VS:  BP 138/78   Pulse 78   Ht '5\' 8"'$  (1.727 m)   Wt 194 lb (88 kg)   SpO2 96%   BMI 29.50 kg/m   GEN: Overweight 61 yo in no acute distress  HEENT: normal  Neck: no JVD, carotid bruits Cardiac: RRR; no murmurs No Le  edema  Respiratory:  clear to auscultation bilaterally GI: soft, nontender, nondistended, + BS  No hepatomegaly  MS: no deformity Moving all extremities   Skin: warm and dry, no rash Neuro:  Strength and sensation are intact Psych: euthymic mood, full affect   EKG:  EKG is not  ordered today.   On 10/26  NSR   T wave inversion inferiorly   Lipid Panel No results found for: "CHOL", "TRIG", "HDL", "CHOLHDL", "VLDL", "LDLCALC", "LDLDIRECT"    Wt Readings from Last 3 Encounters:  02/05/22 194 lb (88 kg)  02/13/19 186 lb (84.4 kg)  01/21/17 183 lb (83 kg)      ASSESSMENT AND PLAN:  1  Dypsnea/ spells    Not clear etiology  Pt is stressed   Has FHx of CAD    Does not some SOB with activity     Will order CCTA to eval for CAD  2  HTN  BP is high in clinic   ANxious    Need home readings   Get cuff  3  LIpids   With atherosclerosis agree with statin  Will get lipomed  4  ? Bruit  Will sched carotid USN  5   Diet   Whole unprocessed food   Low sugar  6   Sleep  Consider Trazodone  7   Stress   Needs to work at limiting, coping   Discussed box breathing  Current medicines are reviewed at length with the patient today.  The patient does not have concerns regarding medicines.  Signed, Dorris Carnes, MD  02/05/2022 10:44 AM    [address], [place], [place]  [address] Phone: [phone]; Fax: [phone]

## 2022-02-06 LAB — LIPOPROTEIN A (LPA): Lipoprotein (a): 71.6 nmol/L — ABNORMAL HIGH (ref <75.0)

## 2022-02-07 LAB — NMR, LIPOPROFILE
Cholesterol, Total: 145 mg/dL (ref 100–199)
HDL Cholesterol by NMR: 57 mg/dL (ref >39)
HDL Particle Number: 38.3 umol/L (ref >=30.5)
LDL Particle Number: 857 nmol/L (ref <1000)
LDL Size: 20.9 nm (ref >20.5)
LDL-C (NIH Calc): 70 mg/dL (ref 0–99)
LP-IR Score: 39 (ref <=45)
Small LDL Particle Number: 425 nmol/L (ref <=527)
Triglycerides by NMR: 99 mg/dL (ref 0–149)

## 2022-02-09 LAB — MISC LABCORP TEST (SEND OUT): Labcorp test code: 167015

## 2022-02-10 ENCOUNTER — Ambulatory Visit (HOSPITAL_COMMUNITY)
Admission: RE | Admit: 2022-02-10 | Discharge: 2022-02-10 | Disposition: A | Discharge status: Home / Self Care | Payer: BC Managed Care – PPO | Source: Ambulatory Visit | Attending: Internal Medicine | Admitting: Internal Medicine

## 2022-02-10 DIAGNOSIS — R0989 Other specified symptoms and signs involving the circulatory and respiratory systems: Secondary | ICD-10-CM | POA: Insufficient documentation

## 2022-02-23 ENCOUNTER — Ambulatory Visit (HOSPITAL_COMMUNITY): Payer: BC Managed Care – PPO

## 2022-03-08 ENCOUNTER — Telehealth (HOSPITAL_COMMUNITY): Payer: Self-pay | Admitting: Emergency Medicine

## 2022-03-08 NOTE — Telephone Encounter (Signed)
Pt with cough and wishes to postpone CCTA to allow her to recover.  New appt 03/16/22 at 3:30pm. Will call her at a later time to review instructions for test Pt appreciated the phone call  Marchia Bond RN Navigator Cardiac Imaging [facility] [phone] Office  [phone] Cell

## 2022-03-10 ENCOUNTER — Ambulatory Visit (HOSPITAL_COMMUNITY): Payer: BC Managed Care – PPO

## 2022-03-12 ENCOUNTER — Telehealth (HOSPITAL_COMMUNITY): Payer: Self-pay | Admitting: *Deleted

## 2022-03-12 NOTE — Telephone Encounter (Signed)
Reaching out to patient to offer assistance regarding upcoming cardiac imaging study; pt verbalizes understanding of appt date/time, parking situation and where to check in, pre-test NPO status and medications ordered, and verified current allergies; name and call back number provided for further questions should they arise  Tymara Saur RN Navigator Cardiac Imaging [facility] [phone] office [phone] cell  Patient to take 100mg metoprolol tartrate two hours prior to her cardiac CT scan. She is aware to arrive at 3pm. 

## 2022-03-16 ENCOUNTER — Ambulatory Visit (HOSPITAL_COMMUNITY): Admission: RE | Admit: 2022-03-16 | Payer: BC Managed Care – PPO | Source: Ambulatory Visit

## 2022-03-19 ENCOUNTER — Telehealth (HOSPITAL_COMMUNITY): Payer: Self-pay | Admitting: *Deleted

## 2022-03-19 NOTE — Telephone Encounter (Signed)
Attempted to call patient regarding upcoming cardiac CT appointment. °Left message on voicemail with name and callback number ° °Bethel Sirois RN Navigator Cardiac Imaging °[facility] °[phone] Office °[phone] Cell ° °

## 2022-03-23 ENCOUNTER — Ambulatory Visit (HOSPITAL_COMMUNITY): Admission: RE | Admit: 2022-03-23 | Payer: BC Managed Care – PPO | Source: Ambulatory Visit

## 2022-04-09 ENCOUNTER — Telehealth: Payer: BC Managed Care – PPO | Admitting: Physician Assistant

## 2022-04-09 DIAGNOSIS — R197 Diarrhea, unspecified: Secondary | ICD-10-CM

## 2022-04-09 NOTE — Progress Notes (Signed)
Because you have had diarrhea for 5 days now and not responding to Imodium, I feel your condition warrants further evaluation and I recommend that you be seen in a face to face visit.   NOTE: There will be NO CHARGE for this eVisit   If you are having a true medical emergency please call 911.      For an urgent face to face visit, [facility] has eight urgent care centers for your convenience:   NEW!! [facility] [place] [facility] [place] Get Driving Directions [phone] [address] [place], [place] [facility] [place] at [place] Get Driving Directions [phone] [place], [place] [address]   [facility]) Get Driving Directions [phone] [address], [place] [address]  [facility] [place] ([place]) Get Driving Directions [phone] [address] [place],  [place]  [address]  [facility] - at [address] Get Driving Directions  [phone] [address] [place],  [place] [address]   [facility] Get Driving Directions [phone] [address] [place], [place], [place] [address]   [facility] at [facility] Get Driving Directions  [phone] [address] [place], [place] [address]    [facility]gent [facility] at [place] Get Driving Directions [phone] [address], [place], [place] [address]  Your MyChart E-visit questionnaire answers were reviewed by a board certified advanced clinical practitioner to complete your personal care plan based on your specific symptoms.  Thank you for using e-Visits.    I have spent 5 minutes in review of e-visit questionnaire, review and updating patient chart, medical decision making and response to patient.   Mar Daring, PA-C

## 2023-05-02 ENCOUNTER — Other Ambulatory Visit: Payer: Self-pay | Admitting: Internal Medicine

## 2023-05-02 ENCOUNTER — Ambulatory Visit
Admission: RE | Admit: 2023-05-02 | Discharge: 2023-05-02 | Disposition: A | Discharge status: Home / Self Care | Payer: No Typology Code available for payment source | Source: Ambulatory Visit | Attending: Internal Medicine | Admitting: Internal Medicine

## 2023-05-02 DIAGNOSIS — R0600 Dyspnea, unspecified: Secondary | ICD-10-CM

## 2023-08-05 ENCOUNTER — Emergency Department (HOSPITAL_COMMUNITY)

## 2023-08-05 ENCOUNTER — Emergency Department (HOSPITAL_COMMUNITY)
Admission: EM | Admit: 2023-08-05 | Discharge: 2023-08-05 | Disposition: A | Discharge status: Home / Self Care | Payer: Worker's Compensation | Attending: Emergency Medicine | Admitting: Emergency Medicine

## 2023-08-05 ENCOUNTER — Other Ambulatory Visit: Payer: Self-pay

## 2023-08-05 ENCOUNTER — Encounter (HOSPITAL_COMMUNITY): Payer: Self-pay

## 2023-08-05 DIAGNOSIS — W01198A Fall on same level from slipping, tripping and stumbling with subsequent striking against other object, initial encounter: Secondary | ICD-10-CM | POA: Diagnosis not present

## 2023-08-05 DIAGNOSIS — Z7982 Long term (current) use of aspirin: Secondary | ICD-10-CM | POA: Diagnosis not present

## 2023-08-05 DIAGNOSIS — S0990XA Unspecified injury of head, initial encounter: Secondary | ICD-10-CM

## 2023-08-05 DIAGNOSIS — M79642 Pain in left hand: Secondary | ICD-10-CM | POA: Insufficient documentation

## 2023-08-05 DIAGNOSIS — Y990 Civilian activity done for income or pay: Secondary | ICD-10-CM | POA: Insufficient documentation

## 2023-08-05 DIAGNOSIS — S60222A Contusion of left hand, initial encounter: Secondary | ICD-10-CM

## 2023-08-05 DIAGNOSIS — S0003XA Contusion of scalp, initial encounter: Secondary | ICD-10-CM | POA: Diagnosis not present

## 2023-08-05 DIAGNOSIS — W19XXXA Unspecified fall, initial encounter: Secondary | ICD-10-CM

## 2023-08-05 MED ORDER — ACETAMINOPHEN 325 MG PO TABS
650.0000 mg | ORAL_TABLET | Freq: Once | ORAL | Status: AC
  Administered 2023-08-05: 650 mg via ORAL
  Filled 2023-08-05: qty 2

## 2023-08-05 NOTE — Discharge Instructions (Signed)
 You were seen in the ER for evaluation of left hand pain and scalp swelling after fall at work today.  Fortunately your CT scan of your head was normal, x-ray of your hand did not show any broken bones your symptoms are likely due to contusion.  You can use the Ace wrap for your hand, use an ice pack for 15 minutes at a time several times a day and keep your hand elevated.  Follow-up with your PCP.  Regarding your head you can also use ice on the swelling on your scalp.  Use Tylenol or ibuprofen as needed for any discomfort and follow-up with your doctor.  If you have any severe headache, vomiting, dizziness or other symptoms come back to the ER right away.

## 2023-08-05 NOTE — ED Provider Notes (Signed)
 [place] EMERGENCY DEPARTMENT AT [facility] Provider Note   CSN: [id number] Arrival date & time: 08/05/23  4034     History  Chief Complaint  Patient presents with   Joan Duncan    Joan Duncan is a 63 y.o. female.  History of GERD and anxiety.  She presents to the ER for evaluation of injury from work.  States she tripped over a pallet of batteries at work and fell backwards striking her head on the concrete very hard.  She states "my head bounced".  She also hit the dorsum of the left hand, though she is not sure what she struck this on.  She is having swelling and bruising to this area.  No numbness or tingling or weakness.  She has normal range of motion of the left hand but states it feels painful when she makes a fist.  She is not on blood thinners.  She denies neck pain, no other injuries.  She did not have loss of consciousness, no dizziness, reports tenderness and a "goose egg" to the back of her head.  She has been using ice with some improvement while waiting.   Fall       Home Medications Prior to Admission medications   Medication Sig Start Date End Date Taking? Authorizing Provider  ALPRAZolam (XANAX) 0.5 MG tablet Take 0.5 mg by mouth 3 (three) times daily as needed. 12/23/21   [provider]  aspirin 81 MG tablet Take 81 mg by mouth daily.    [provider]  BIOTIN PO Take 1 tablet by mouth daily.    [provider]  co-enzyme Q-10 30 MG capsule Take 30 mg by mouth daily.    [provider]  loratadine (CLARITIN) 10 MG tablet Take 10 mg by mouth daily.    [provider]  metoprolol  tartrate (LOPRESSOR ) 100 MG tablet Take 1 tablet (100 mg total) by mouth as directed. Take 2 Hours Prior to CT Scan 02/05/22 05/06/22  Elmyra Haggard, MD  Multiple Vitamin (MULTIVITAMIN) tablet Take 1 tablet by mouth daily.    [provider]  Omega-3 Fatty Acids (FISH OIL PEARLS PO) Take 1 capsule by mouth daily. Patient not  taking: Reported on 02/05/2022    [provider]  pantoprazole (PROTONIX) 40 MG tablet Take 40 mg by mouth 2 (two) times daily.  05/20/14   [provider]  rosuvastatin (CRESTOR) 10 MG tablet Take 10 mg by mouth daily. 11/02/21   [provider]  zolpidem (AMBIEN) 10 MG tablet Take 10 mg by mouth at bedtime as needed for sleep.    [provider]  estradiol  (ESTRACE ) 1 MG tablet Take 1 tablet (1 mg total) by mouth daily. 05/22/14 09/13/18  Ferguson, John V, MD      Allergies    Patient has no known allergies.    Review of Systems   Review of Systems  Physical Exam Updated Vital Signs BP (!) 185/95 (BP Location: Right Arm)   Pulse 70   Temp 97.7 F (36.5 C) (Oral)   Resp 18   Ht 5\' 8"  (1.727 m)   Wt 90.7 kg   SpO2 97%   BMI 30.41 kg/m  Physical Exam Vitals and nursing note reviewed.  Constitutional:      General: She is not in acute distress.    Appearance: She is well-developed.  HENT:     Head: Normocephalic.     Comments: Contusion to occipital area with mild tenderness,  no fluctuance, no overlying laceration.  No crepitus.    Right Ear: Tympanic membrane normal.     Left Ear: Tympanic membrane normal.     Mouth/Throat:     Mouth: Mucous membranes are moist.  Eyes:     Extraocular Movements: Extraocular movements intact.     Conjunctiva/sclera: Conjunctivae normal.     Pupils: Pupils are equal, round, and reactive to light.  Cardiovascular:     Rate and Rhythm: Normal rate and regular rhythm.     Heart sounds: No murmur heard. Pulmonary:     Effort: Pulmonary effort is normal. No respiratory distress.     Breath sounds: Normal breath sounds.  Abdominal:     Palpations: Abdomen is soft.     Tenderness: There is no abdominal tenderness.  Musculoskeletal:        General: Swelling present.     Cervical back: Normal range of motion and neck supple. No rigidity or tenderness.     Comments: Swelling and tenderness to the dorsum of the  left hand diffusely.  No specific bony tenderness, no deformity, normal range of motion of the hand.  Radial pulse intact, sensation intact in the fingers with normal capillary refill in the fingers.  Skin:    General: Skin is warm and dry.     Capillary Refill: Capillary refill takes less than 2 seconds.  Neurological:     General: No focal deficit present.     Mental Status: She is alert and oriented to person, place, and time.     Sensory: No sensory deficit.     Motor: No weakness.  Psychiatric:        Mood and Affect: Mood normal.     ED Results / Procedures / Treatments   Labs (all labs ordered are listed, but only abnormal results are displayed) Labs Reviewed - No data to display  EKG None  Radiology No results found.  Procedures Procedures    Medications Ordered in ED Medications - No data to display  ED Course/ Medical Decision Making/ A&P                                 Medical Decision Making Differential diagnosis includes but not limited to concussion, intracranial hemorrhage, skull fracture, contusion, sprain, strain, other  Course: Patient here for evaluation after work injury.  She had a mechanical fall after she tripped on a pallet of batteries at work.  She struck her head on concrete but had a ground-level fall and she is not on blood thinners.  LOC, not having headache, normal neuroexam.  Discussed with patient that overall low risk but may have mild concussion.  Patient is very anxious about going home, she states he lives alone does not have any to check on her and is worried that she would die in her sleep if something was missed.  She is agreeable with CT versus observation but ultimately after discussion we decided on CT scan to rule out any intracranial findings and this was negative.  She did have some swelling to the dorsum of her left hand x-ray is normal there are no fractures, no dislocations and she has normal function of the left hand.   Discussed likely contusion, she was given Ace wrap and advised on ice, elevation and follow-up and return precautions.  Amount and/or Complexity of Data Reviewed Radiology: ordered and independent interpretation performed.    Details: CT head-no  intracranial hemorrhage, no skull fracture X-ray left hand no fracture or dislocation  I agree with radiology reads  Risk OTC drugs.           Final Clinical Impression(s) / ED Diagnoses Final diagnoses:  None    Rx / DC Orders ED Discharge Orders     None         Aimee Houseman, PA-C 08/05/23 1846    Teddi Favors, DO 08/09/23 [phone]

## 2023-08-05 NOTE — ED Triage Notes (Signed)
 Pt fell at work and has knot on the back of her head and bruise on her left hand
